# Patient Record
Sex: Female | Born: 1975 | Race: Black or African American | Hispanic: No | Marital: Single | Smoking: Former smoker
Health system: Southern US, Community
[De-identification: ages and names within clinical notes are randomized; demographics above are authoritative.]

## PROBLEM LIST (undated history)

## (undated) DIAGNOSIS — M1990 Unspecified osteoarthritis, unspecified site: Secondary | ICD-10-CM

## (undated) DIAGNOSIS — I10 Essential (primary) hypertension: Secondary | ICD-10-CM

## (undated) DIAGNOSIS — E119 Type 2 diabetes mellitus without complications: Secondary | ICD-10-CM

## (undated) DIAGNOSIS — N939 Abnormal uterine and vaginal bleeding, unspecified: Secondary | ICD-10-CM

## (undated) DIAGNOSIS — D509 Iron deficiency anemia, unspecified: Secondary | ICD-10-CM

## (undated) DIAGNOSIS — Z8669 Personal history of other diseases of the nervous system and sense organs: Secondary | ICD-10-CM

## (undated) DIAGNOSIS — R7303 Prediabetes: Secondary | ICD-10-CM

## (undated) DIAGNOSIS — N921 Excessive and frequent menstruation with irregular cycle: Secondary | ICD-10-CM

## (undated) HISTORY — DX: Essential (primary) hypertension: I10

## (undated) HISTORY — DX: Iron deficiency anemia, unspecified: D50.9

## (undated) HISTORY — DX: Excessive and frequent menstruation with irregular cycle: N92.1

---

## 1999-08-13 ENCOUNTER — Emergency Department (HOSPITAL_COMMUNITY): Admission: EM | Admit: 1999-08-13 | Discharge: 1999-08-13 | Payer: Self-pay | Admitting: Emergency Medicine

## 1999-11-27 ENCOUNTER — Other Ambulatory Visit: Admission: RE | Admit: 1999-11-27 | Discharge: 1999-11-27 | Payer: Self-pay | Admitting: Internal Medicine

## 2000-08-18 ENCOUNTER — Emergency Department (HOSPITAL_COMMUNITY): Admission: EM | Admit: 2000-08-18 | Discharge: 2000-08-18 | Payer: Self-pay | Admitting: Emergency Medicine

## 2000-09-15 ENCOUNTER — Encounter: Admission: RE | Admit: 2000-09-15 | Discharge: 2000-09-15 | Payer: Self-pay | Admitting: Internal Medicine

## 2000-09-15 ENCOUNTER — Encounter: Payer: Self-pay | Admitting: Internal Medicine

## 2000-09-24 ENCOUNTER — Encounter: Payer: Self-pay | Admitting: Emergency Medicine

## 2000-09-24 ENCOUNTER — Emergency Department (HOSPITAL_COMMUNITY): Admission: EM | Admit: 2000-09-24 | Discharge: 2000-09-24 | Payer: Self-pay | Admitting: Emergency Medicine

## 2000-10-05 ENCOUNTER — Other Ambulatory Visit: Admission: RE | Admit: 2000-10-05 | Discharge: 2000-10-05 | Payer: Self-pay | Admitting: *Deleted

## 2001-03-03 ENCOUNTER — Encounter: Payer: Self-pay | Admitting: Internal Medicine

## 2001-03-03 ENCOUNTER — Encounter: Admission: RE | Admit: 2001-03-03 | Discharge: 2001-03-03 | Payer: Self-pay | Admitting: Internal Medicine

## 2001-03-03 ENCOUNTER — Ambulatory Visit (HOSPITAL_COMMUNITY): Admission: RE | Admit: 2001-03-03 | Discharge: 2001-03-03 | Payer: Self-pay | Admitting: Internal Medicine

## 2001-12-12 ENCOUNTER — Emergency Department (HOSPITAL_COMMUNITY): Admission: EM | Admit: 2001-12-12 | Discharge: 2001-12-12 | Payer: Self-pay | Admitting: Emergency Medicine

## 2001-12-18 ENCOUNTER — Inpatient Hospital Stay (HOSPITAL_COMMUNITY): Admission: AD | Admit: 2001-12-18 | Discharge: 2001-12-18 | Payer: Self-pay | Admitting: Obstetrics

## 2002-12-30 ENCOUNTER — Inpatient Hospital Stay (HOSPITAL_COMMUNITY): Admission: AD | Admit: 2002-12-30 | Discharge: 2002-12-30 | Payer: Self-pay | Admitting: Obstetrics and Gynecology

## 2003-05-23 ENCOUNTER — Emergency Department (HOSPITAL_COMMUNITY): Admission: AD | Admit: 2003-05-23 | Discharge: 2003-05-23 | Payer: Self-pay | Admitting: Family Medicine

## 2005-03-05 ENCOUNTER — Ambulatory Visit: Payer: Self-pay | Admitting: Internal Medicine

## 2005-03-12 ENCOUNTER — Ambulatory Visit: Payer: Self-pay | Admitting: Internal Medicine

## 2005-03-18 ENCOUNTER — Ambulatory Visit: Payer: Self-pay | Admitting: Internal Medicine

## 2005-03-18 ENCOUNTER — Encounter (INDEPENDENT_AMBULATORY_CARE_PROVIDER_SITE_OTHER): Payer: Self-pay | Admitting: Internal Medicine

## 2005-03-30 ENCOUNTER — Ambulatory Visit (HOSPITAL_COMMUNITY): Admission: RE | Admit: 2005-03-30 | Discharge: 2005-03-30 | Payer: Self-pay | Admitting: Internal Medicine

## 2005-05-26 ENCOUNTER — Ambulatory Visit: Payer: Self-pay | Admitting: Internal Medicine

## 2005-10-08 ENCOUNTER — Ambulatory Visit: Payer: Self-pay | Admitting: Internal Medicine

## 2005-10-19 ENCOUNTER — Ambulatory Visit: Payer: Self-pay | Admitting: Internal Medicine

## 2006-03-01 DIAGNOSIS — I10 Essential (primary) hypertension: Secondary | ICD-10-CM | POA: Insufficient documentation

## 2006-03-01 DIAGNOSIS — R609 Edema, unspecified: Secondary | ICD-10-CM

## 2006-06-27 ENCOUNTER — Emergency Department (HOSPITAL_COMMUNITY): Admission: EM | Admit: 2006-06-27 | Discharge: 2006-06-27 | Payer: Self-pay | Admitting: Family Medicine

## 2006-09-22 ENCOUNTER — Inpatient Hospital Stay (HOSPITAL_COMMUNITY): Admission: AD | Admit: 2006-09-22 | Discharge: 2006-09-22 | Payer: Self-pay | Admitting: Obstetrics & Gynecology

## 2008-07-14 ENCOUNTER — Inpatient Hospital Stay (HOSPITAL_COMMUNITY): Admission: AD | Admit: 2008-07-14 | Discharge: 2008-07-14 | Payer: Self-pay | Admitting: Obstetrics and Gynecology

## 2008-07-14 ENCOUNTER — Ambulatory Visit: Payer: Self-pay | Admitting: Obstetrics and Gynecology

## 2008-07-25 ENCOUNTER — Emergency Department (HOSPITAL_COMMUNITY): Admission: EM | Admit: 2008-07-25 | Discharge: 2008-07-25 | Payer: Self-pay | Admitting: Family Medicine

## 2009-04-27 HISTORY — PX: TUBAL LIGATION: SHX77

## 2009-08-22 ENCOUNTER — Ambulatory Visit: Payer: Self-pay | Admitting: Obstetrics & Gynecology

## 2009-08-22 ENCOUNTER — Encounter: Payer: Self-pay | Admitting: Obstetrics & Gynecology

## 2009-08-29 ENCOUNTER — Ambulatory Visit (HOSPITAL_COMMUNITY): Admission: RE | Admit: 2009-08-29 | Discharge: 2009-08-29 | Payer: Self-pay | Admitting: Family Medicine

## 2009-08-29 ENCOUNTER — Encounter: Payer: Self-pay | Admitting: Family Medicine

## 2009-09-12 ENCOUNTER — Ambulatory Visit (HOSPITAL_COMMUNITY): Admission: RE | Admit: 2009-09-12 | Discharge: 2009-09-12 | Payer: Self-pay | Admitting: Family Medicine

## 2009-09-12 ENCOUNTER — Ambulatory Visit: Payer: Self-pay | Admitting: Obstetrics & Gynecology

## 2009-09-26 ENCOUNTER — Ambulatory Visit (HOSPITAL_COMMUNITY): Admission: RE | Admit: 2009-09-26 | Discharge: 2009-09-26 | Payer: Self-pay | Admitting: Family Medicine

## 2009-09-26 ENCOUNTER — Encounter: Payer: Self-pay | Admitting: Family Medicine

## 2009-10-03 ENCOUNTER — Ambulatory Visit: Payer: Self-pay | Admitting: Obstetrics & Gynecology

## 2009-10-17 ENCOUNTER — Ambulatory Visit: Payer: Self-pay | Admitting: Obstetrics & Gynecology

## 2009-10-17 LAB — CONVERTED CEMR LAB
Trich, Wet Prep: NONE SEEN
Yeast Wet Prep HPF POC: NONE SEEN

## 2009-10-31 ENCOUNTER — Ambulatory Visit: Payer: Self-pay | Admitting: Obstetrics and Gynecology

## 2009-11-18 ENCOUNTER — Ambulatory Visit: Payer: Self-pay | Admitting: Obstetrics & Gynecology

## 2009-11-21 ENCOUNTER — Encounter: Payer: Self-pay | Admitting: Family Medicine

## 2009-11-21 ENCOUNTER — Ambulatory Visit (HOSPITAL_COMMUNITY): Admission: RE | Admit: 2009-11-21 | Discharge: 2009-11-21 | Payer: Self-pay | Admitting: Family Medicine

## 2009-12-05 ENCOUNTER — Ambulatory Visit: Payer: Self-pay | Admitting: Obstetrics & Gynecology

## 2009-12-05 LAB — CONVERTED CEMR LAB
MCHC: 32.6 g/dL (ref 30.0–36.0)
RBC: 4.07 M/uL (ref 3.87–5.11)
WBC: 7.8 10*3/uL (ref 4.0–10.5)

## 2009-12-13 ENCOUNTER — Ambulatory Visit: Payer: Self-pay | Admitting: Obstetrics & Gynecology

## 2009-12-13 ENCOUNTER — Encounter (INDEPENDENT_AMBULATORY_CARE_PROVIDER_SITE_OTHER): Payer: Self-pay | Admitting: *Deleted

## 2009-12-19 ENCOUNTER — Ambulatory Visit: Payer: Self-pay | Admitting: Family Medicine

## 2010-01-02 ENCOUNTER — Other Ambulatory Visit: Payer: Self-pay | Admitting: Obstetrics & Gynecology

## 2010-01-02 ENCOUNTER — Ambulatory Visit: Payer: Self-pay | Admitting: Obstetrics & Gynecology

## 2010-01-16 ENCOUNTER — Encounter: Payer: Self-pay | Admitting: Family Medicine

## 2010-01-16 ENCOUNTER — Ambulatory Visit: Payer: Self-pay | Admitting: Obstetrics & Gynecology

## 2010-01-16 ENCOUNTER — Ambulatory Visit (HOSPITAL_COMMUNITY): Admission: RE | Admit: 2010-01-16 | Discharge: 2010-01-16 | Payer: Self-pay | Admitting: Family Medicine

## 2010-01-20 ENCOUNTER — Ambulatory Visit: Payer: Self-pay | Admitting: Obstetrics & Gynecology

## 2010-01-23 ENCOUNTER — Ambulatory Visit: Payer: Self-pay | Admitting: Obstetrics & Gynecology

## 2010-01-27 ENCOUNTER — Ambulatory Visit: Payer: Self-pay | Admitting: Obstetrics and Gynecology

## 2010-01-30 ENCOUNTER — Encounter: Payer: Self-pay | Admitting: Physician Assistant

## 2010-01-30 ENCOUNTER — Ambulatory Visit: Payer: Self-pay | Admitting: Obstetrics & Gynecology

## 2010-01-30 LAB — CONVERTED CEMR LAB
ALT: 11 units/L (ref 0–35)
BUN: 7 mg/dL (ref 6–23)
Calcium: 9 mg/dL (ref 8.4–10.5)
Creatinine, Ser: 0.59 mg/dL (ref 0.40–1.20)
Creatinine, Urine: 249.5 mg/dL
Glucose, Bld: 105 mg/dL — ABNORMAL HIGH (ref 70–99)
Hemoglobin: 11.1 g/dL — ABNORMAL LOW (ref 12.0–15.0)
MCHC: 32.2 g/dL (ref 30.0–36.0)
MCV: 80.8 fL (ref 78.0–100.0)
Platelets: 238 10*3/uL (ref 150–400)
Potassium: 3.9 meq/L (ref 3.5–5.3)
RDW: 15.3 % (ref 11.5–15.5)
Total Bilirubin: 0.4 mg/dL (ref 0.3–1.2)
WBC: 6.3 10*3/uL (ref 4.0–10.5)

## 2010-02-03 ENCOUNTER — Ambulatory Visit: Payer: Self-pay | Admitting: Family Medicine

## 2010-02-06 ENCOUNTER — Ambulatory Visit: Payer: Self-pay | Admitting: Family Medicine

## 2010-02-06 LAB — CONVERTED CEMR LAB
Albumin: 3.7 g/dL (ref 3.5–5.2)
Alkaline Phosphatase: 121 units/L — ABNORMAL HIGH (ref 39–117)
CO2: 25 meq/L (ref 19–32)
Chlamydia, DNA Probe: NEGATIVE
Chloride: 103 meq/L (ref 96–112)
GC Probe Amp, Genital: NEGATIVE
Glucose, Bld: 106 mg/dL — ABNORMAL HIGH (ref 70–99)
MCHC: 31.9 g/dL (ref 30.0–36.0)
Platelets: 237 10*3/uL (ref 150–400)
Total Bilirubin: 0.5 mg/dL (ref 0.3–1.2)
Total Protein: 6.8 g/dL (ref 6.0–8.3)
WBC: 6.6 10*3/uL (ref 4.0–10.5)

## 2010-02-07 ENCOUNTER — Encounter: Payer: Self-pay | Admitting: Family Medicine

## 2010-02-07 ENCOUNTER — Encounter: Payer: Self-pay | Admitting: Obstetrics & Gynecology

## 2010-02-07 LAB — CONVERTED CEMR LAB: Creatinine 24 HR UR: 889 mg/24hr (ref 700–1800)

## 2010-02-10 ENCOUNTER — Ambulatory Visit: Payer: Self-pay | Admitting: Obstetrics & Gynecology

## 2010-02-13 ENCOUNTER — Ambulatory Visit: Payer: Self-pay | Admitting: Obstetrics & Gynecology

## 2010-02-17 ENCOUNTER — Ambulatory Visit: Payer: Self-pay | Admitting: Obstetrics & Gynecology

## 2010-02-20 ENCOUNTER — Ambulatory Visit: Payer: Self-pay | Admitting: Obstetrics & Gynecology

## 2010-02-21 ENCOUNTER — Ambulatory Visit: Payer: Self-pay | Admitting: Obstetrics and Gynecology

## 2010-02-21 ENCOUNTER — Encounter: Payer: Self-pay | Admitting: Obstetrics & Gynecology

## 2010-02-21 ENCOUNTER — Inpatient Hospital Stay (HOSPITAL_COMMUNITY)
Admission: RE | Admit: 2010-02-21 | Discharge: 2010-02-24 | Payer: Self-pay | Source: Home / Self Care | Admitting: Obstetrics and Gynecology

## 2010-03-19 ENCOUNTER — Ambulatory Visit: Payer: Self-pay | Admitting: Obstetrics & Gynecology

## 2010-05-01 ENCOUNTER — Ambulatory Visit
Admission: RE | Admit: 2010-05-01 | Discharge: 2010-05-01 | Payer: Self-pay | Source: Home / Self Care | Attending: Obstetrics and Gynecology | Admitting: Obstetrics and Gynecology

## 2010-07-09 LAB — POCT URINALYSIS DIPSTICK
Bilirubin Urine: NEGATIVE
Glucose, UA: NEGATIVE mg/dL
Hgb urine dipstick: NEGATIVE
Ketones, ur: NEGATIVE mg/dL
Nitrite: NEGATIVE
Nitrite: NEGATIVE
Protein, ur: 100 mg/dL — AB
Specific Gravity, Urine: 1.025 (ref 1.005–1.030)
Specific Gravity, Urine: >=1.03 (ref 1.005–1.030)
Urobilinogen, UA: 0.2 mg/dL (ref 0.0–1.0)
Urobilinogen, UA: 0.2 mg/dL (ref 0.0–1.0)
pH: 6 (ref 5.0–8.0)
pH: 6.5 (ref 5.0–8.0)

## 2010-07-09 LAB — COMPREHENSIVE METABOLIC PANEL
ALT: 23 U/L (ref 0–35)
AST: 31 U/L (ref 0–37)
Alkaline Phosphatase: 135 U/L — ABNORMAL HIGH (ref 39–117)
Calcium: 8.7 mg/dL (ref 8.4–10.5)
Chloride: 102 mEq/L (ref 96–112)
GFR calc Af Amer: >60 mL/min (ref >60)
GFR calc non Af Amer: >60 mL/min (ref >60)
Total Bilirubin: 0.8 mg/dL (ref 0.3–1.2)
Total Protein: 7.2 g/dL (ref 6.0–8.3)

## 2010-07-09 LAB — CBC
HCT: 41.2 % (ref 36.0–46.0)
MCH: 26.9 pg (ref 26.0–34.0)
MCH: 27 pg (ref 26.0–34.0)
MCHC: 32.1 g/dL (ref 30.0–36.0)
MCV: 83.6 fL (ref 78.0–100.0)
RBC: 3.42 MIL/uL — ABNORMAL LOW (ref 3.87–5.11)
WBC: 22.8 10*3/uL — ABNORMAL HIGH (ref 4.0–10.5)
WBC: 8.2 10*3/uL (ref 4.0–10.5)

## 2010-07-09 LAB — PROTEIN / CREATININE RATIO, URINE
Protein Creatinine Ratio: 0.68 — ABNORMAL HIGH (ref 0.00–0.15)
Total Protein, Urine: 171 mg/dL

## 2010-07-09 LAB — URIC ACID: Uric Acid, Serum: 5.8 mg/dL (ref 2.4–7.0)

## 2010-07-10 LAB — POCT URINALYSIS DIPSTICK
Bilirubin Urine: NEGATIVE
Bilirubin Urine: NEGATIVE
Glucose, UA: >=1000 mg/dL — AB
Glucose, UA: NEGATIVE mg/dL
Glucose, UA: NEGATIVE mg/dL
Ketones, ur: NEGATIVE mg/dL
Ketones, ur: 15 mg/dL — AB
Ketones, ur: NEGATIVE mg/dL
Nitrite: NEGATIVE
Nitrite: NEGATIVE
Specific Gravity, Urine: >=1.03 (ref 1.005–1.030)
Urobilinogen, UA: 0.2 mg/dL (ref 0.0–1.0)
Urobilinogen, UA: 1 mg/dL (ref 0.0–1.0)
pH: 6 (ref 5.0–8.0)

## 2010-07-11 LAB — POCT URINALYSIS DIPSTICK
Bilirubin Urine: NEGATIVE
Glucose, UA: NEGATIVE mg/dL
Ketones, ur: NEGATIVE mg/dL
Specific Gravity, Urine: 1.02 (ref 1.005–1.030)
Urobilinogen, UA: 0.2 mg/dL (ref 0.0–1.0)

## 2010-07-12 LAB — POCT URINALYSIS DIP (DEVICE)
Bilirubin Urine: NEGATIVE
Glucose, UA: 250 mg/dL — AB
Hgb urine dipstick: NEGATIVE
Ketones, ur: NEGATIVE mg/dL
Nitrite: NEGATIVE
Specific Gravity, Urine: 1.02 (ref 1.005–1.030)

## 2010-07-13 LAB — POCT URINALYSIS DIP (DEVICE)
Bilirubin Urine: NEGATIVE
Ketones, ur: NEGATIVE mg/dL
Ketones, ur: NEGATIVE mg/dL
Protein, ur: 100 mg/dL — AB
Specific Gravity, Urine: 1.02 (ref 1.005–1.030)
pH: 6 (ref 5.0–8.0)

## 2010-07-14 LAB — POCT URINALYSIS DIP (DEVICE)
Bilirubin Urine: NEGATIVE
Bilirubin Urine: NEGATIVE
Glucose, UA: 500 mg/dL — AB
Glucose, UA: NEGATIVE mg/dL
Specific Gravity, Urine: >=1.03 (ref 1.005–1.030)
Specific Gravity, Urine: 1.025 (ref 1.005–1.030)
Urobilinogen, UA: 0.2 mg/dL (ref 0.0–1.0)

## 2010-07-15 LAB — POCT URINALYSIS DIP (DEVICE)
Bilirubin Urine: NEGATIVE
Hgb urine dipstick: NEGATIVE
Ketones, ur: NEGATIVE mg/dL
Specific Gravity, Urine: <=1.005 (ref 1.005–1.030)
pH: 6 (ref 5.0–8.0)

## 2010-08-07 LAB — GC/CHLAMYDIA PROBE AMP, GENITAL
Chlamydia, DNA Probe: NEGATIVE
GC Probe Amp, Genital: NEGATIVE

## 2010-10-23 ENCOUNTER — Emergency Department (HOSPITAL_COMMUNITY)
Admission: EM | Admit: 2010-10-23 | Discharge: 2010-10-23 | Disposition: A | Discharge status: Home / Self Care | Payer: Medicaid Other | Attending: Emergency Medicine | Admitting: Emergency Medicine

## 2010-10-23 DIAGNOSIS — R209 Unspecified disturbances of skin sensation: Secondary | ICD-10-CM | POA: Insufficient documentation

## 2010-10-23 DIAGNOSIS — R51 Headache: Secondary | ICD-10-CM | POA: Insufficient documentation

## 2010-10-23 DIAGNOSIS — I10 Essential (primary) hypertension: Secondary | ICD-10-CM | POA: Insufficient documentation

## 2010-10-23 LAB — POCT I-STAT, CHEM 8
Creatinine, Ser: 0.8 mg/dL (ref 0.50–1.10)
HCT: 37 % (ref 36.0–46.0)
Hemoglobin: 12.6 g/dL (ref 12.0–15.0)
Potassium: 3.8 mEq/L (ref 3.5–5.1)
Sodium: 138 mEq/L (ref 135–145)
TCO2: 26 mmol/L (ref 0–100)

## 2010-11-06 ENCOUNTER — Encounter: Payer: Self-pay | Admitting: Internal Medicine

## 2010-11-10 ENCOUNTER — Encounter: Payer: Self-pay | Admitting: Internal Medicine

## 2010-11-10 ENCOUNTER — Ambulatory Visit (INDEPENDENT_AMBULATORY_CARE_PROVIDER_SITE_OTHER): Payer: Self-pay | Admitting: Internal Medicine

## 2010-11-10 DIAGNOSIS — R209 Unspecified disturbances of skin sensation: Secondary | ICD-10-CM

## 2010-11-10 DIAGNOSIS — R51 Headache: Secondary | ICD-10-CM

## 2010-11-10 DIAGNOSIS — Z0000 Encounter for general adult medical examination without abnormal findings: Secondary | ICD-10-CM | POA: Insufficient documentation

## 2010-11-10 DIAGNOSIS — R200 Anesthesia of skin: Secondary | ICD-10-CM | POA: Insufficient documentation

## 2010-11-10 DIAGNOSIS — R519 Headache, unspecified: Secondary | ICD-10-CM | POA: Insufficient documentation

## 2010-11-10 DIAGNOSIS — Z1239 Encounter for other screening for malignant neoplasm of breast: Secondary | ICD-10-CM | POA: Insufficient documentation

## 2010-11-10 DIAGNOSIS — I10 Essential (primary) hypertension: Secondary | ICD-10-CM

## 2010-11-10 LAB — CBC
HCT: 35 % — ABNORMAL LOW (ref 36.0–46.0)
Hemoglobin: 11.2 g/dL — ABNORMAL LOW (ref 12.0–15.0)
MCHC: 32 g/dL (ref 30.0–36.0)
RBC: 4.6 MIL/uL (ref 3.87–5.11)
WBC: 6.4 10*3/uL (ref 4.0–10.5)

## 2010-11-10 LAB — LIPID PANEL
Cholesterol: 149 mg/dL (ref 0–200)
HDL: 56 mg/dL (ref >39)
LDL Cholesterol: 78 mg/dL (ref 0–99)
Triglycerides: 75 mg/dL (ref <150)
VLDL: 15 mg/dL (ref 0–40)

## 2010-11-10 MED ORDER — FLUTICASONE PROPIONATE 50 MCG/ACT NA SUSP: 2.0000 | Freq: Every day | NASAL | Status: DC

## 2010-11-10 MED ORDER — HYDROCHLOROTHIAZIDE 25 MG PO TABS: 25.0000 mg | ORAL_TABLET | Freq: Every day | ORAL | Status: DC

## 2010-11-10 MED ORDER — AMLODIPINE BESYLATE 5 MG PO TABS: 5.0000 mg | ORAL_TABLET | Freq: Every day | ORAL | Status: DC

## 2010-11-10 NOTE — Assessment & Plan Note (Addendum)
Poor control.  BP high today and typical of her home pressures while taking HCTZ.    Plan: Continue HCTZ 25 mg daily Start Amlodipine 5mg  daily BMET today

## 2010-11-10 NOTE — Progress Notes (Deleted)
  Subjective:    Patient ID: Nancy Silva, female    DOB: 1975-05-23, 34 y.o.   MRN: [id number]  HPI    Review of Systems     Objective:   Physical Exam        Assessment & Plan:

## 2010-11-10 NOTE — Assessment & Plan Note (Addendum)
R>L hand.  Possibly correlated with headaches per history.  Based on distribution and positive Phalen's sign in setting of a lot of computer use at work, could also be carpal tunnel syndrome.  Other peripheral neuropathies would also be on the differential.  Neck exam and age make degenerative cervical disease less likely.  Consider further workup for neuropathy if brain MRI comes back negative.    Plan: Follow-up brain MRI result and re-evaluate.

## 2010-11-10 NOTE — Assessment & Plan Note (Addendum)
Will get lipid panel today as well as BMET (glucose).  Has not eaten for 5 hrs before going to the lab for these.  At 2 week follow-up she needs a tdap as well.

## 2010-11-10 NOTE — Progress Notes (Signed)
Subjective:    Patient ID: Nancy Silva, female    DOB: 10-16-1975, 35 y.o.   MRN: [id number]  HPI  [name] is a 35 y/o with a history of hypertension and preeclampsia who presents with a two month history of daily headaches.  The headache is there when she wakes up in the morning and gets worse throughout the day.  The pain is throbbing and varies in location between her forehead, temples, and behind her eyes.  Today has been one of the best days and her pain is currently 5/10 severity.  When the headaches are bad, she experiences photophobia and sometimes sees dark spots.  Her vision gets a little blurry when she has a bad headache, but no double vision.  No flashing lights or blind spots.  She does have mild nausea at times but no vomiting.  During the worst headaches she sometimes experiences chest tightness that quickly resolves.  No SOB or dyspnea on exertion.  No fever or chills.  She presented to the ED two week ago and was given ibuprofen 800mg , which helps some.  Tylenol 1000mg  also helps somewhat.  She experiences numbness and tingling in her hands, R>L.  These are worse when her headache is worse.  She also experiences a sharp pain in her right thigh and right ankle when her headaches are at the worst.    She does have a history of chronic nasal/sinus problems, which are currently at her baseline of "always there".    She has a sister with multiple sclerosis.  Her mother died of unknown cancer at age 1.    She has a history of hypertension with pressures at home in 140s/90s usually, with the highest being 170/98.  She was previously on HCTZ before running out of pills a several months ago, and she was restarted on HCTZ two weeks ago in the ED.  (140s/90s is while taking HCTZ)     Review of Systems Review of Systems - History obtained from the patient General ROS: negative for - chills, fatigue, fever or weight loss Respiratory ROS: no cough, shortness of breath, or  wheezing Cardiovascular ROS: positive for - chest tightness as per HPI negative for - dyspnea on exertion, irregular heartbeat, palpitations or shortness of breath Gastrointestinal ROS: no abdominal pain, change in bowel habits, or black or bloody stools Genito-Urinary ROS: no dysuria, trouble voiding, or hematuria Neurological ROS: See HPI      Objective:   Physical Exam  Filed Vitals:   11/10/10 1511  BP: 146/94  Pulse: 75  Temp: 97.1 F (36.2 C)    General: alert, well-developed, and cooperative to examination.  Head: normocephalic and atraumatic.  Eyes: vision grossly intact, pupils equal, pupils round, pupils reactive to light, no injection and anicteric.  Mouth: pharynx pink and moist, no erythema, and no exudates.  Neck: supple, full ROM, no thyromegaly, no JVD.  No tenderness to palpation. Lungs: normal respiratory effort, no accessory muscle use, normal breath sounds, no crackles, and no wheezes. Heart: normal rate, regular rhythm, no murmur, no gallop, and no rub.  Pulses: 2+ DP/PT pulses bilaterally  Extremities: No cyanosis, clubbing, edema.  No tenderness to palpation in hands, thighs, or ankles.  No erythema in hands.  Neurologic: alert & oriented X3, cranial nerves II-XII intact, strength normal in all extremities distally and proximally. Good strength in all fingers.  Sensation intact to light touch intact and symmetric in both hands on volar and dorsal surfaces.   Negative  Tinel's sign bilaterally.  Positive Phalen's sign.  Skin: turgor normal and no rashes.  Psych: Oriented X3, memory intact for recent and remote, normally interactive, good eye contact, not anxious appearing, and not depressed appearing.        Assessment & Plan:

## 2010-11-10 NOTE — Assessment & Plan Note (Addendum)
Continuous nature with possibly correlated neurological symptoms in her hands and right leg.  May be related to her chronic sinusitis, but CNS etiology, including multiple sclerosis, must also be ruled out.   Could also be a result of her HTN, but her pressures do not seem to be extreme enough to cause such headaches.  Continuous nature is less typical of migraine.  Tension headaches also on the differential.  Plan: Start Flonase daily Brain MRI with contrast Continue ibuprofen and tylenol PRN CBC today Follow-up in two weeks

## 2010-11-10 NOTE — Patient Instructions (Signed)
Please follow-up in 2 weeks.  We will order an MRI for you once you have met with Rudell Cobb.

## 2010-11-11 ENCOUNTER — Other Ambulatory Visit: Payer: Self-pay | Admitting: Internal Medicine

## 2010-11-11 DIAGNOSIS — I10 Essential (primary) hypertension: Secondary | ICD-10-CM

## 2010-11-11 LAB — BASIC METABOLIC PANEL WITH GFR
BUN: 10 mg/dL (ref 6–23)
Chloride: 99 mEq/L (ref 96–112)
GFR, Est African American: >60 mL/min (ref >60)
GFR, Est Non African American: >60 mL/min (ref >60)
Potassium: 3.1 mEq/L — ABNORMAL LOW (ref 3.5–5.3)
Sodium: 138 mEq/L (ref 135–145)

## 2010-11-11 MED ORDER — TRIAMTERENE-HCTZ 37.5-25 MG PO CAPS: 1.0000 | ORAL_CAPSULE | ORAL | Status: DC

## 2010-11-11 NOTE — Progress Notes (Signed)
In response to K 3.1 yesterday, medication change.  Discontinued HCTZ 25mg  daily and started Triamterene-HCTZ 37.5-25mg  daily.  I called the patient about the lab result and medication change, and I called in the prescription to [facility] at [facility] at [address] [phone].

## 2010-11-21 NOTE — Progress Notes (Signed)
I agree with Dr. Thane Edu assessment and plan.

## 2010-11-24 ENCOUNTER — Ambulatory Visit: Payer: Self-pay | Admitting: Internal Medicine

## 2010-11-28 ENCOUNTER — Ambulatory Visit: Payer: Self-pay | Admitting: Physician Assistant

## 2010-12-15 ENCOUNTER — Ambulatory Visit (INDEPENDENT_AMBULATORY_CARE_PROVIDER_SITE_OTHER): Payer: Self-pay | Admitting: Internal Medicine

## 2010-12-15 ENCOUNTER — Encounter: Payer: Self-pay | Admitting: Internal Medicine

## 2010-12-15 VITALS — BP 152/84 | HR 110 | Temp 97.8°F | Ht 67.0 in | Wt 227.6 lb

## 2010-12-15 DIAGNOSIS — Z23 Encounter for immunization: Secondary | ICD-10-CM

## 2010-12-15 DIAGNOSIS — Z0000 Encounter for general adult medical examination without abnormal findings: Secondary | ICD-10-CM

## 2010-12-15 DIAGNOSIS — R51 Headache: Secondary | ICD-10-CM

## 2010-12-15 DIAGNOSIS — I10 Essential (primary) hypertension: Secondary | ICD-10-CM

## 2010-12-15 DIAGNOSIS — R200 Anesthesia of skin: Secondary | ICD-10-CM

## 2010-12-15 DIAGNOSIS — J302 Other seasonal allergic rhinitis: Secondary | ICD-10-CM

## 2010-12-15 DIAGNOSIS — J309 Allergic rhinitis, unspecified: Secondary | ICD-10-CM

## 2010-12-15 DIAGNOSIS — R209 Unspecified disturbances of skin sensation: Secondary | ICD-10-CM

## 2010-12-15 MED ORDER — FLUTICASONE PROPIONATE 50 MCG/ACT NA SUSP: 2.0000 | Freq: Every day | NASAL | Status: DC

## 2010-12-15 NOTE — Assessment & Plan Note (Addendum)
Much improved.  Has only had two headaches since her daily headaches resolved on day #3 of taking flonase.  With this resolution, her headaches are likely sinus headaches/due to chronic sinusitis.  In retrospect, her two months of chronic headaches would have started in April or May, which fits seasonal allergies as a cause of sinus headaches.  Will hold off on MRI at this time, which she had not yet had done, since she has a very likely diagnosis due to resolution with treatment.    Will continue Flonase two sprays (one each nostril) daily until follow-up in three months for maintenance treatment of her sinus allergies.

## 2010-12-15 NOTE — Assessment & Plan Note (Signed)
Improved.  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   Recommended that she try night time splinting while sleeping if she continues to have tingling.  Explained that she can get a splint to keep her hand in about 30-45 deg of flexion at a pharmacy otc.

## 2010-12-15 NOTE — Progress Notes (Signed)
Subjective:    Patient ID: Nancy Silva, female    DOB: [birth year], 35 y.o.   MRN: [id number]  HPI [name] is a 35 y/o woman with a history of HTN and allergies who presented last visit with a two month history of continuous headaches every day.  She was prescribed a two week course of flonase, and an MRI was to be scheduled.  Her headaches resolved after the first three days of flonase, which she continued for the whole 14 day course.  She has had two headaches since then in the subsequent month, both times when her seasonal allergies were aggravated.   She also had tingling in her hands associated with the headache, R>L hand.  She reports that the left hand tingling has resolved and she has had the R hand tingling much less often.  This happens mostly after work typing all day or when she wakes up in the morning.  The stabbing pain she was having in her thigh has not occurred since last visit either.     Review of Systems ROS: General: no fevers, chills, changes in weight, changes in appetite Skin: no rash HEENT: no blurry vision, hearing changes, sore throat Pulm: no dyspnea, coughing, wheezing CV: no chest pain, palpitations, shortness of breath Abd: no abdominal pain, nausea/vomiting, diarrhea/constipation GU: no dysuria, hematuria, polyuria Ext: no arthralgias, myalgias Neuro: see HPI      Objective:   Physical Exam Filed Vitals:   12/15/10 1549  BP: 152/84  Pulse: 110  Temp: 97.8 F (36.6 C)   Recheck: BP 136/72  P 102  General: alert, well-developed, and cooperative to examination.  Head: normocephalic and atraumatic.  Eyes: vision grossly intact, pupils equal, pupils round, pupils reactive to light, no injection and anicteric.  Neck: supple, full ROM, no JVD. Lungs: normal respiratory effort, no accessory muscle use, normal breath sounds, no crackles, and no wheezes. Heart: normal rate, regular rhythm, no murmur, no gallop, and no rub.  Pulses: 2+ DP/PT  pulses bilaterally Extremities: No cyanosis, clubbing, edema.   Neurologic: alert & oriented X3, cranial nerves II-XII intact, strength normal in all extremities, sensation intact to light touch and pinprick in all fingers and symmetric between hands, and gait normal. Phalens and tinel's signs negative in R hand.  No thenar atrophy in R hand. Skin: turgor normal and no rashes.  Psych: Oriented X3, memory intact for recent and remote, normally interactive, good eye contact, not anxious appearing, and not depressed appearing.      Assessment & Plan:

## 2010-12-15 NOTE — Assessment & Plan Note (Signed)
Tdap given today.

## 2010-12-15 NOTE — Patient Instructions (Signed)
Please return to clinic in three months

## 2010-12-15 NOTE — Progress Notes (Deleted)
  Subjective:    Patient ID: Nancy Silva, female    DOB: 06/08/1975, 35 y.o.   MRN: [id number]  HPI    Review of Systems     Objective:   Physical Exam        Assessment & Plan:   

## 2010-12-15 NOTE — Assessment & Plan Note (Signed)
With the resolution of her headaches, her headaches are likely sinus headaches/due to chronic sinusitis, likely due to allergies.  In retrospect, her two months of chronic headaches would have started in April or May, which fits seasonal allergies as a cause of sinus headaches.   Will continue Flonase two sprays (one each nostril) daily until follow-up in three months for maintenance treatment of her sinus allergies.

## 2010-12-15 NOTE — Progress Notes (Signed)
Addended by: Emeline General on: 12/15/2010 04:41 PM   Modules accepted: Orders

## 2010-12-15 NOTE — Assessment & Plan Note (Addendum)
On recheck today blood pressure appears to be well controlled on Amlodipine and Dyazide.  She reports that BP and pulse were elevated on arrival because she was rushing to make her appointment, which fits with her time of arrival.   Will check BMET today since she was hypokalemic on HCTZ last visit.  HCTZ was subsequently switched to Dyazide.   Follow-up for recheck for her HTN in three months.

## 2010-12-16 LAB — BASIC METABOLIC PANEL WITH GFR
BUN: 12 mg/dL (ref 6–23)
Calcium: 9.4 mg/dL (ref 8.4–10.5)
Chloride: 101 mEq/L (ref 96–112)
Creat: 0.89 mg/dL (ref 0.50–1.10)
GFR, Est Non African American: >60 mL/min (ref >60)

## 2010-12-22 NOTE — Progress Notes (Signed)
I discssed Nancy Silva with Dr Yaakov Guthrie and agree with his assessment and plan as outlined here.

## 2011-04-16 ENCOUNTER — Encounter: Payer: Self-pay | Admitting: Family

## 2011-04-16 ENCOUNTER — Ambulatory Visit (INDEPENDENT_AMBULATORY_CARE_PROVIDER_SITE_OTHER): Payer: Self-pay | Admitting: Family

## 2011-04-16 VITALS — BP 145/80 | HR 77 | Temp 98.5°F | Ht 67.0 in | Wt 224.1 lb

## 2011-04-16 DIAGNOSIS — Z0000 Encounter for general adult medical examination without abnormal findings: Secondary | ICD-10-CM

## 2011-04-16 DIAGNOSIS — Z01419 Encounter for gynecological examination (general) (routine) without abnormal findings: Secondary | ICD-10-CM

## 2011-04-16 LAB — WET PREP, GENITAL: Yeast Wet Prep HPF POC: NONE SEEN

## 2011-04-16 NOTE — Progress Notes (Signed)
  Subjective:     Nancy Silva is a 35 y.o. female and is here for a comprehensive physical exam. The patient reports problems - white vaginal irritation with odor x 2 weeks.  Also reports left sided pelvic pain that started two weeks ago.  Marland Kitchen  History   Social History  . Marital Status: Single    Spouse Name: N/A    Number of Children: N/A  . Years of Education: N/A   Occupational History  . Not on file.   Social History Main Topics  . Smoking status: Former Smoker    Types: Cigarettes    Quit date: 11/10/2007  . Smokeless tobacco: Never Used  . Alcohol Use: No  . Drug Use: No  . Sexually Active: Yes    Birth Control/ Protection: None   Other Topics Concern  . Not on file   Social History Narrative  . No narrative on file   Health Maintenance  Topic Date Due  . Pap Smear  08/14/1992  . Influenza Vaccine  01/26/2011  . Tetanus/tdap  12/14/2020    The following portions of the patient's history were reviewed and updated as appropriate: allergies, current medications, past family history, past medical history, past social history, past surgical history and problem list.  Review of Systems Pertinent items are noted in HPI.   Objective:    BP 145/80  Pulse 77  Temp(Src) 98.5 F (36.9 C) (Oral)  Ht 5\' 7"  (1.702 m)  Wt 224 lb 1.6 oz (101.651 kg)  BMI 35.10 kg/m2  LMP 03/22/2011 General appearance: alert, cooperative and appears stated age Head: Normocephalic, without obvious abnormality, atraumatic Neck: no adenopathy, no carotid bruit, no JVD, supple, symmetrical, trachea midline and thyroid not enlarged, symmetric, no tenderness/mass/nodules Lungs: clear to auscultation bilaterally Breasts: normal appearance, no masses or tenderness Heart: regular rate and rhythm, S1, S2 normal, no murmur, click, rub or gallop Abdomen: soft, non-tender; bowel sounds normal; no masses,  no organomegaly Pelvic: cervix normal in appearance, external genitalia normal, no adnexal  masses or tenderness, no cervical motion tenderness, rectovaginal septum normal and uterus normal size, shape, and consistency; adnexa and uterus difficult to palpate secondary to weight.  Yellowish-green vaginal discharge, no odor. Extremities: extremities normal, atraumatic, no cyanosis or edema    Assessment:    Well Woman Exam Abnormal Vaginal Discharge Pelvic Pain   Plan:  GC/CT pending Pap smear to lab Wet prep - lab Pelvic US schedule    F/u in 2 weeks [facility]

## 2011-04-16 NOTE — Patient Instructions (Signed)
Vaginitis Vaginitis is an infection. It causes soreness, swelling, and redness (inflammation) of the vagina. Many of these infections are sexually transmitted diseases. Having unprotected sex can cause further problems and complications such as:  Chronic pelvic pain.     Infertility.    Unwanted pregnancy.     Abortions.    Tubal pregnancy.     Infection passed on to the newborn baby.     Cancer.  CAUSES   Monilia. A yeast/fungus infection, not a sexually transmitted disease.     Bacterial vaginosis. The normal balance of bacteria in the vagina is disrupted and is replaced by an overgrowth of certain bacteria.     Gonorrhea, chlamydia. These are bacterial infections that are sexually transmitted diseases (STD).     Vaginal sponges, diaphragms, and intrauterine devices.     Trichomoniasis. This is a protozoa parasite that is a STD.     Viruses like herpes and human papilloma virus. Both are STD's.     Pregnancy.    Immunosupression (HIV).     Using bubble bath.     Taking certain medications that kill germs (antibiotics).     Sporadic recurrence can occur if you become ill.     Diabetes.    Steriods.    Allergic reaction. If you have an allergy to:     Douches.    Soaps.    Spermicides.    Condoms.    Scented tampons and vaginal sprays.  SYMPTOMS    Abnormal vaginal discharge.     Itching of the vagina.     Pain in the vagina.     Swelling of the vagina.     In some cases, there are no symptoms.  TREATMENT   Treatment will vary depending on the type of infection.  Bacteria and trichomonas - usually oral antibiotics and sometimes vaginal cream or suppositories.     Monilia vaginitis - vaginal creams, suppositories, or oral antifungal.     Viral vaginitis has no cure. However, the symptoms of herpes (a viral vaginitis) can be treated to relieve the discomfort. Human papilloma virus has no symptoms . However, there are treatments for the diseases  caused by human papilloma virus.     Allergic vaginitis. Stop using the product that is causing the problem. Vaginal creams can be used to treat the symptoms.     When treating an STD, the sex partner should also be treated.  HOME CARE INSTRUCTIONS    Take all the medications as prescribed by your caregiver.     Do not use scented tampons, soaps, or vaginal sprays.     Do not douche.     Tell your sex partner if you have a vaginal infection or a STD.     Do not have sexual intercourse until you have cured the vaginitis.     Practice safe sex by using condoms .  SEEK MEDICAL CARE IF:    You have an oral temperature above 102 F (38.9 C).     You develop abdominal pain.     Your symptoms get worse during treatment.  Document Released: 02/08/2007 Document Revised: 10/27/2010 Document Reviewed: 10/04/2008 [facility] Patient Information 2012 [place], [place].

## 2011-04-27 ENCOUNTER — Ambulatory Visit (HOSPITAL_COMMUNITY)
Admission: RE | Admit: 2011-04-27 | Discharge: 2011-04-27 | Disposition: A | Discharge status: Home / Self Care | Payer: Self-pay | Source: Ambulatory Visit | Attending: Obstetrics & Gynecology | Admitting: Obstetrics & Gynecology

## 2011-04-27 DIAGNOSIS — Z8041 Family history of malignant neoplasm of ovary: Secondary | ICD-10-CM | POA: Insufficient documentation

## 2011-04-27 DIAGNOSIS — N949 Unspecified condition associated with female genital organs and menstrual cycle: Secondary | ICD-10-CM | POA: Insufficient documentation

## 2011-04-27 DIAGNOSIS — Z01419 Encounter for gynecological examination (general) (routine) without abnormal findings: Secondary | ICD-10-CM

## 2011-06-08 ENCOUNTER — Telehealth: Payer: Self-pay | Admitting: *Deleted

## 2011-06-08 NOTE — Telephone Encounter (Signed)
Pt left message requesting results from last visit- Pap and wet prep.  Do not leave a message if no answer on call back.

## 2011-06-09 NOTE — Telephone Encounter (Signed)
Returned pt call and was told by a female that answered the phone that Nancy Silva is @ work. I stated that I will call back.   *Note: pt had negative wet prep @ last visit. 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 She will need to continue to get yearly Pap tests. Her GC/Chlamydia tests were negative.

## 2011-06-10 NOTE — Telephone Encounter (Signed)
Called and spoke w/pt. I informed her of all test results. She had many questions about the +HPV on her Pap.  I answered them to her satisfaction. Pt states she is still having a vaginal d/c similar to when she was in our office. She has tried OTC meds without good results. I stated that something may have changed since her visit on 04/16/11. She may call back tomorrow to schedule office appt if desired. Pt voiced understanding.

## 2011-06-15 ENCOUNTER — Ambulatory Visit: Payer: Self-pay | Admitting: Family Medicine

## 2011-06-24 ENCOUNTER — Ambulatory Visit: Payer: Self-pay | Admitting: Obstetrics and Gynecology

## 2011-09-07 ENCOUNTER — Encounter: Payer: Self-pay | Admitting: Internal Medicine

## 2011-09-07 ENCOUNTER — Ambulatory Visit (INDEPENDENT_AMBULATORY_CARE_PROVIDER_SITE_OTHER): Payer: Self-pay | Admitting: Internal Medicine

## 2011-09-07 VITALS — BP 130/82 | HR 76 | Temp 97.3°F | Ht 67.0 in | Wt 228.4 lb

## 2011-09-07 DIAGNOSIS — J302 Other seasonal allergic rhinitis: Secondary | ICD-10-CM

## 2011-09-07 DIAGNOSIS — I10 Essential (primary) hypertension: Secondary | ICD-10-CM

## 2011-09-07 MED ORDER — AMLODIPINE BESYLATE 5 MG PO TABS: 5.0000 mg | ORAL_TABLET | Freq: Every day | ORAL | Status: DC

## 2011-09-07 MED ORDER — FLUTICASONE PROPIONATE 50 MCG/ACT NA SUSP: 2.0000 | Freq: Every day | NASAL | Status: DC

## 2011-09-07 MED ORDER — TRIAMTERENE-HCTZ 37.5-25 MG PO CAPS: 1.0000 | ORAL_CAPSULE | ORAL | Status: DC

## 2011-09-07 NOTE — Patient Instructions (Signed)
Please return to clinic in 1 year. Please take your blood pressure once per day for 2 weeks and record the result.  Please rest for at least 5 minutes before taking your blood pressure.  Call the clinic after two weeks and ask for the triage nurse.  Please give her a range of your blood pressures, the high and low for both top and bottom number.

## 2011-09-26 NOTE — Assessment & Plan Note (Addendum)
BP on my recheck 130/82. At home BPs 135-145 / 80s. Looks good on current regimen, recheck in 3 months.  Continue Dyazide combination plus amlodipine.  Asked patient to get cuff and check her blood pressure for a couple weeks at home and call clinic with the measurements.  If these look ok she can go one year until next visit

## 2011-09-26 NOTE — Progress Notes (Signed)
  Subjective:   Patient ID: Nancy Silva female   DOB: [birth year] 36 y.o.   MRN: [id number]  HPI: [name] is a 36 y.o. with HTN here for follow-up and med refills.  No complaints    Past Medical History  Diagnosis Date  . Hypertension    Current Outpatient Prescriptions  Medication Sig Dispense Refill  . amLODipine (NORVASC) 5 MG tablet Take 1 tablet (5 mg total) by mouth daily.  30 tablet  11  . fluticasone (FLONASE) 50 MCG/ACT nasal spray Place 2 sprays into the nose daily. Place 1 spray per nostril daily  16 g  11  . triamterene-hydrochlorothiazide (DYAZIDE) 37.5-25 MG per capsule Take 1 each (1 capsule total) by mouth every morning.  30 capsule  11   Family History  Problem Relation Age of Onset  . Cancer Mother     ovarian   History   Social History  . Marital Status: Single    Spouse Name: N/A    Number of Children: N/A  . Years of Education: N/A   Social History Main Topics  . Smoking status: Former Smoker    Types: Cigarettes    Quit date: 11/10/2007  . Smokeless tobacco: Never Used  . Alcohol Use: No  . Drug Use: No  . Sexually Active: Yes    Birth Control/ Protection: None   Other Topics Concern  . None   Social History Narrative  . None   Review of Systems: Constitutional: Denies fever, chills, diaphoresis, appetite change and fatigue.  Respiratory: Denies SOB, DOE, cough, chest tightness,  and wheezing.   Cardiovascular: Denies chest pain, palpitations and leg swelling.  Gastrointestinal: Denies nausea, vomiting, abdominal pain, diarrhea, constipation, blood in stool and abdominal distention.  Genitourinary: Denies dysuria, urgency, frequency, hematuria, flank pain and difficulty urinating.  Musculoskeletal: Denies myalgias, back pain, joint swelling, arthralgias and gait problem.  Skin: Denies pallor, rash and wound.  Neurological: Denies dizziness, seizures, syncope, weakness, light-headedness, numbness and headaches.     Objective:  Physical Exam: Filed Vitals:   09/07/11 1419 09/07/11 1500  BP: 151/94 130/82  Pulse: 76   Temp: 97.3 F (36.3 C)   TempSrc: Oral   Height: 5\' 7"  (1.702 m)   Weight: 228 lb 6.4 oz (103.602 kg)    Constitutional: Vital signs reviewed.  Patient is a well-developed and well-nourished woman in no acute distress and cooperative with exam. Alert and oriented x3.  Head: Normocephalic and atraumatic Mouth: no erythema or exudates, MMM Eyes: PERRL, EOMI, conjunctivae normal, No scleral icterus.  Neck: No JVD  Cardiovascular: RRR, S1 normal, S2 normal, no MRG, pulses symmetric and intact bilaterally Pulmonary/Chest: CTAB, no wheezes, rales, or rhonchi Musculoskeletal: No joint deformities, erythema, or stiffness, ROM full and no nontender Neurological: A&O x3, Strength is normal and symmetric bilaterally, cranial nerve II-XII are grossly intact, no focal motor deficit, sensory intact to light touch bilaterally.  Skin: Warm, dry and intact. No rash, cyanosis, or clubbing.    Assessment & Plan:

## 2011-11-15 ENCOUNTER — Encounter (HOSPITAL_COMMUNITY): Payer: Self-pay | Admitting: *Deleted

## 2011-11-15 ENCOUNTER — Inpatient Hospital Stay (HOSPITAL_COMMUNITY)
Admission: AD | Admit: 2011-11-15 | Discharge: 2011-11-15 | Disposition: A | Discharge status: Home / Self Care | Payer: Self-pay | Source: Ambulatory Visit | Attending: Obstetrics and Gynecology | Admitting: Obstetrics and Gynecology

## 2011-11-15 DIAGNOSIS — N949 Unspecified condition associated with female genital organs and menstrual cycle: Secondary | ICD-10-CM | POA: Insufficient documentation

## 2011-11-15 DIAGNOSIS — A5901 Trichomonal vulvovaginitis: Secondary | ICD-10-CM | POA: Insufficient documentation

## 2011-11-15 LAB — URINALYSIS, ROUTINE W REFLEX MICROSCOPIC
Glucose, UA: NEGATIVE mg/dL
Nitrite: NEGATIVE
Specific Gravity, Urine: 1.025 (ref 1.005–1.030)
pH: 6 (ref 5.0–8.0)

## 2011-11-15 LAB — WET PREP, GENITAL: Clue Cells Wet Prep HPF POC: NONE SEEN

## 2011-11-15 LAB — GLUCOSE, CAPILLARY: Glucose-Capillary: 117 mg/dL — ABNORMAL HIGH (ref 70–99)

## 2011-11-15 LAB — URINE MICROSCOPIC-ADD ON

## 2011-11-15 MED ORDER — METRONIDAZOLE 500 MG PO TABS: 500.0000 mg | ORAL_TABLET | Freq: Two times a day (BID) | ORAL | Status: AC

## 2011-11-15 MED ORDER — METRONIDAZOLE 500 MG PO TABS: 500.0000 mg | ORAL_TABLET | Freq: Two times a day (BID) | ORAL | Status: DC

## 2011-11-15 MED ORDER — FLUCONAZOLE 150 MG PO TABS
150.0000 mg | ORAL_TABLET | Freq: Once | ORAL | Status: AC
  Administered 2011-11-15: 150 mg via ORAL
  Filled 2011-11-15: qty 1

## 2011-11-15 NOTE — MAU Note (Signed)
Pt reports having a thick yellow mucusy vaginal discharge x 1 month. Has tried some over the counter yeast medication without much relief.

## 2011-11-15 NOTE — MAU Provider Note (Signed)
Attestation of Attending Supervision of Advanced Practitioner: Evaluation and management procedures were performed by the PA/NP/CNM/OB Fellow under my supervision/collaboration. Chart reviewed and agree with management and plan.  Littleton Haub V 11/15/2011 1:39 PM    

## 2011-11-15 NOTE — [facility] Provider Note (Signed)
History     CSN: [id number]  Arrival date and time: 11/15/11 1212   First Provider Initiated Contact with Patient 11/15/11 1245      Chief Complaint  Patient presents with  . Vaginal Discharge   HPI Nancy Silva is a 36 y.o. female who presents to [facility]U with vaginal discharge. She is not pregnant. The discharge started about a month ago. She describes the discharge as creamy yellow. Associated symptoms include itching and irritation with some vaginal swelling. She has used OTC medication without relief. LMP 11/02/11. No birth control, condoms sometimes. Current sex partner >2 years. Hx of Chlamydia. Last pap smear less than one year ago here at the [facility] and was abnormal. Dx with HPV. The history was provided by the patient.  OB History    Grav Para Term Preterm Abortions TAB SAB Ect Mult Living   3 2 2  0 1 0 1 0 0 2      Past Medical History  Diagnosis Date  . Hypertension     Past Surgical History  Procedure Date  . Cesarean section   . Tubal ligation 2011    Family History  Problem Relation Age of Onset  . Cancer Mother     ovarian    History  Substance Use Topics  . Smoking status: Former Smoker    Types: Cigarettes    Quit date: 11/10/2007  . Smokeless tobacco: Never Used  . Alcohol Use: No    Allergies:  Allergies  Allergen Reactions  . Peanuts (Nuts)     Throat swelling  . Shellfish Allergy     Throat Swelling    Prescriptions prior to admission  Medication Sig Dispense Refill  . amLODipine (NORVASC) 5 MG tablet Take 1 tablet (5 mg total) by mouth daily.  30 tablet  11  . fluticasone (FLONASE) 50 MCG/ACT nasal spray Place 2 sprays into the nose daily. Place 1 spray per nostril daily  16 g  11  . triamterene-hydrochlorothiazide (DYAZIDE) 37.5-25 MG per capsule Take 1 each (1 capsule total) by mouth every morning.  30 capsule  11    Review of Systems  Constitutional: Negative for fever, chills and weight loss.  HENT: Negative for ear  pain, nosebleeds, congestion, sore throat and neck pain.   Eyes: Negative for blurred vision, double vision, photophobia and pain.  Respiratory: Negative for cough, shortness of breath and wheezing.   Cardiovascular: Negative for chest pain, palpitations and leg swelling.  Gastrointestinal: Negative for heartburn, nausea, vomiting, abdominal pain, diarrhea and constipation.  Genitourinary: Negative for dysuria, urgency and frequency.       Vaginal discharge  Musculoskeletal: Negative for myalgias and back pain.  Skin: Positive for rash (eczema). Negative for itching.  Neurological: Negative for dizziness, sensory change, speech change, seizures, weakness and headaches.  Endo/Heme/Allergies: Does not bruise/bleed easily.  Psychiatric/Behavioral: Negative for depression. The patient is not nervous/anxious and does not have insomnia.    Physical Exam   Blood pressure 132/74, pulse 96, temperature 97.1 F (36.2 C), temperature source Oral, resp. rate 18, last menstrual period 11/02/2011.  Physical Exam  Nursing note and vitals reviewed. Constitutional: She is oriented to person, place, and time. She appears well-developed and well-nourished. No distress.  HENT:  Head: Normocephalic and atraumatic.  Eyes: EOM are normal.  Neck: Neck supple.  Cardiovascular: Normal rate.   Respiratory: Effort normal.  GI: Soft. There is no tenderness.  Genitourinary:       External genitalia with erythema and  edema. Thick yellow vaginal discharge. Cervix inflamed, no CMT, no adnexal tenderness. Uterus without palpable enlargement.  Musculoskeletal: Normal range of motion.  Neurological: She is alert and oriented to person, place, and time.  Skin: Skin is warm and dry.  Psychiatric: She has a normal mood and affect. Her behavior is normal. Judgment and thought content normal.   Results for orders placed during the hospital encounter of 11/15/11 (from the past 24 hour(s))  URINALYSIS, ROUTINE W REFLEX  MICROSCOPIC     Status: Abnormal   Collection Time   11/15/11 12:18 PM      Component Value Range   Color, Urine YELLOW  YELLOW   APPearance CLOUDY (*) CLEAR   Specific Gravity, Urine 1.025  1.005 - 1.030   pH 6.0  5.0 - 8.0   Glucose, UA NEGATIVE  NEGATIVE mg/dL   Hgb urine dipstick MODERATE (*) NEGATIVE   Bilirubin Urine NEGATIVE  NEGATIVE   Ketones, ur NEGATIVE  NEGATIVE mg/dL   Protein, ur 30 (*) NEGATIVE mg/dL   Urobilinogen, UA 0.2  0.0 - 1.0 mg/dL   Nitrite NEGATIVE  NEGATIVE   Leukocytes, UA MODERATE (*) NEGATIVE  URINE MICROSCOPIC-ADD ON     Status: Abnormal   Collection Time   11/15/11 12:18 PM      Component Value Range   Squamous Epithelial / LPF FEW (*) RARE   WBC, UA 21-50  <3 WBC/hpf   RBC / HPF 11-20  <3 RBC/hpf   Bacteria, UA MANY (*) RARE   Urine-Other TRICHOMONAS PRESENT    POCT PREGNANCY, URINE     Status: Normal   Collection Time   11/15/11 12:24 PM      Component Value Range   Preg Test, Ur NEGATIVE  NEGATIVE  WET PREP, GENITAL     Status: Abnormal   Collection Time   11/15/11  1:00 PM      Component Value Range   Yeast Wet Prep HPF POC NONE SEEN  NONE SEEN   Trich, Wet Prep FEW (*) NONE SEEN   Clue Cells Wet Prep HPF POC NONE SEEN  NONE SEEN   WBC, Wet Prep HPF POC MANY (*) NONE SEEN  GLUCOSE, CAPILLARY     Status: Abnormal   Collection Time   11/15/11  1:05 PM      Component Value Range   Glucose-Capillary 117 (*) 70 - 99 mg/dL   Assessment: Trichomonas vaginosis  Plan:  Rx Flagyl   GC, Chlamydia cultures pending   Follow up with GYN Clinic   Procedures    Shawnice Tilmon 11/15/2011, 12:46 PM

## 2012-10-06 ENCOUNTER — Other Ambulatory Visit: Payer: Self-pay | Admitting: Internal Medicine

## 2012-10-06 DIAGNOSIS — I10 Essential (primary) hypertension: Secondary | ICD-10-CM

## 2012-10-10 ENCOUNTER — Encounter: Payer: Self-pay | Admitting: Internal Medicine

## 2012-10-10 NOTE — Telephone Encounter (Signed)
Please call patient for a follow up appointment for her blood pressure. She has not been seen in almost one year.   Thanks!  SK

## 2012-10-24 ENCOUNTER — Encounter: Payer: Self-pay | Admitting: Internal Medicine

## 2012-11-14 ENCOUNTER — Ambulatory Visit (INDEPENDENT_AMBULATORY_CARE_PROVIDER_SITE_OTHER): Payer: Self-pay | Admitting: Internal Medicine

## 2012-11-14 ENCOUNTER — Encounter: Payer: Self-pay | Admitting: Internal Medicine

## 2012-11-14 VITALS — BP 142/88 | HR 72 | Temp 97.4°F | Ht 67.0 in | Wt 227.1 lb

## 2012-11-14 DIAGNOSIS — D649 Anemia, unspecified: Secondary | ICD-10-CM

## 2012-11-14 DIAGNOSIS — R7309 Other abnormal glucose: Secondary | ICD-10-CM

## 2012-11-14 DIAGNOSIS — D509 Iron deficiency anemia, unspecified: Secondary | ICD-10-CM

## 2012-11-14 DIAGNOSIS — E669 Obesity, unspecified: Secondary | ICD-10-CM | POA: Insufficient documentation

## 2012-11-14 DIAGNOSIS — Z0000 Encounter for general adult medical examination without abnormal findings: Secondary | ICD-10-CM

## 2012-11-14 DIAGNOSIS — J309 Allergic rhinitis, unspecified: Secondary | ICD-10-CM

## 2012-11-14 DIAGNOSIS — D259 Leiomyoma of uterus, unspecified: Secondary | ICD-10-CM | POA: Insufficient documentation

## 2012-11-14 DIAGNOSIS — J302 Other seasonal allergic rhinitis: Secondary | ICD-10-CM

## 2012-11-14 DIAGNOSIS — R739 Hyperglycemia, unspecified: Secondary | ICD-10-CM

## 2012-11-14 DIAGNOSIS — I10 Essential (primary) hypertension: Secondary | ICD-10-CM

## 2012-11-14 HISTORY — DX: Iron deficiency anemia, unspecified: D50.9

## 2012-11-14 MED ORDER — HYDROCHLOROTHIAZIDE 12.5 MG PO CAPS: 12.5000 mg | ORAL_CAPSULE | Freq: Every day | ORAL | Status: DC

## 2012-11-14 MED ORDER — LISINOPRIL 20 MG PO TABS: 20.0000 mg | ORAL_TABLET | Freq: Every day | ORAL | Status: DC

## 2012-11-14 NOTE — Assessment & Plan Note (Signed)
-  Ordered Lipid panel.  -Ordered HgA1C for diabetes screening.  -Pt to make appointment with her OB/GYN for PAP smear. -Pt denies depression on depression screening.

## 2012-11-14 NOTE — Assessment & Plan Note (Signed)
She is very interested in weight loss and will start exercising soon. She has a friend to help her during her work-outs. Her diet is reported as healthy with no fried foods.

## 2012-11-14 NOTE — Assessment & Plan Note (Signed)
Hemoglobin in 11-12 range two years ago. Likely due to menorrhagia 2/2 uterine fibroids.  -CBC, TSH (will order fT4 as indicated)

## 2012-11-14 NOTE — Assessment & Plan Note (Signed)
She takes Allegra OTC as needed. No complaints during this visit.

## 2012-11-14 NOTE — Assessment & Plan Note (Signed)
BP Readings from Last 3 Encounters:  11/14/12 142/88  11/15/11 132/74  09/07/11 130/82    Lab Results  Component Value Date   NA 139 12/15/2010   K 3.6 12/15/2010   CREATININE 0.89 12/15/2010    Assessment: Blood pressure control:  mildly elevated Progress toward BP goal:   deteriorated Comments: Pt unable to afford current anti-hypertensives, has not taken them in 2.5 weeks.   Plan: Medications:  will start Lisinopril 20mg  daily (pt recalls taking this medicine in the past but with no reaction). HCTZ 12.5 mg daily. (discontinued Norvasc 5mg  daily, Triamterene-HCTZ 37.5-25mg  daily) Educational resources provided: brochure Self management tools provided: home blood pressure logbook Other plans: Will consider Lisinopril-HCTZ combo pill if she does well with Lisinopril. Of note, pt is s/p bilateral tubal ligation 2 years ago with no plan for reversal of this procedure for pregnancy planning.

## 2012-11-14 NOTE — Patient Instructions (Addendum)
General Instructions: -Follow up with your OB/GYN for PAP smear and for evaluation of heavy menstrual periods.  -Follow up with [place] in two weeks for blood pressure recheck. Your new blood pressure medications are Lisinopril 20mg  daily and Hydrochlorothiazide 12.5 mg daily.     Treatment Goals:  Goals (1 Years of Data) as of 11/14/12         As of Today 11/15/11 09/07/11     Blood Pressure    . Blood Pressure < 140/90  142/88 132/74 130/82      Progress Toward Treatment Goals:    Self Care Goals & Plans:  Self Care Goal 11/14/2012  Manage my medications take my medicines as prescribed; refill my medications on time  Monitor my health keep track of my blood pressure  Eat healthy foods eat foods that are low in salt; eat baked foods instead of fried foods  Be physically active find an activity I enjoy       Care Management & Community Referrals:

## 2012-11-14 NOTE — Assessment & Plan Note (Signed)
Pt reports being told in the past that she had small uterine fibroids. She explains that her periods are regular but are getting heavier with time.  -Pt encouraged to follow up with her OB/GYN for reevaluation of menorrhagia and uterine fibroids as well as for her yearly Pap smear. She will call and make the appointment.

## 2012-11-14 NOTE — Progress Notes (Signed)
  Subjective:    Patient ID: Nancy Silva, female    DOB: October 05, 1975, 37 y.o.   MRN: [id number]  HPI Ms. Murley is a pleasant 37 year old woman with no PMH of HTN, seasonal allergies, uterine fibroids, s/p bilateral tubal ligation, who comes in for follow up visit. She has been out of her HTN medications for 2.5 weeks now due to their high cost. She has had heavy menstrual cycles for years with anemia and was evaluated by her OB/GYN in the past and found to have small uterine fibroids. She tells me that her periods are heavier now and she is feeling more tired, irritable, with cold intolerance. She is very motivated to lose weight and has an exercise plan with easy access to a local gym and support from a friend. She had no other complaints.     Review of Systems  Constitutional: Positive for fatigue. Negative for fever, chills, activity change, appetite change and unexpected weight change.  Respiratory: Negative for cough and shortness of breath.   Cardiovascular: Negative for chest pain and leg swelling.  Gastrointestinal: Negative for abdominal pain and constipation.  Genitourinary: Positive for menstrual problem. Negative for vaginal bleeding and vaginal discharge.  Skin: Positive for pallor.  Neurological: Negative for dizziness and light-headedness.  Psychiatric/Behavioral: Negative for behavioral problems and agitation. The patient is nervous/anxious.        Objective:   Physical Exam  Nursing note and vitals reviewed. Constitutional: She is oriented to person, place, and time. She appears well-developed and well-nourished. No distress.  HENT:  Head: Atraumatic.  Eyes: Conjunctivae are normal. No scleral icterus.  Neck: Neck supple. No thyromegaly present.  Cardiovascular: Normal rate and regular rhythm.  Exam reveals no gallop and no friction rub.   Murmur heard. 2/6 Left upper sternal border flow murmur  Pulmonary/Chest: Effort normal and breath sounds normal. No  respiratory distress. She has no wheezes. She has no rales.  Abdominal: Soft. Bowel sounds are normal. She exhibits no distension. There is no tenderness.  Musculoskeletal: She exhibits no edema and no tenderness.  Neurological: She is alert and oriented to person, place, and time.  Skin: Skin is warm and dry. No rash noted. She is not diaphoretic. No erythema. There is pallor.  Psychiatric: She has a normal mood and affect. Her behavior is normal.          Assessment & Plan:

## 2012-11-15 LAB — CBC
HCT: 31.5 % — ABNORMAL LOW (ref 36.0–46.0)
MCV: 73.9 fL — ABNORMAL LOW (ref 78.0–100.0)
RBC: 4.26 MIL/uL (ref 3.87–5.11)
RDW: 17 % — ABNORMAL HIGH (ref 11.5–15.5)
WBC: 6.2 10*3/uL (ref 4.0–10.5)

## 2012-11-15 LAB — BASIC METABOLIC PANEL
BUN: 11 mg/dL (ref 6–23)
CO2: 29 mEq/L (ref 19–32)
Chloride: 102 mEq/L (ref 96–112)
Creat: 0.82 mg/dL (ref 0.50–1.10)

## 2012-11-15 NOTE — Progress Notes (Signed)
Case discussed with Dr. Kennerly at the time of the visit.  We reviewed the resident's history and exam and pertinent patient test results.  I agree with the assessment, diagnosis, and plan of care documented in the resident's note. 

## 2012-11-28 ENCOUNTER — Encounter: Payer: Self-pay | Admitting: Internal Medicine

## 2012-11-28 ENCOUNTER — Ambulatory Visit (INDEPENDENT_AMBULATORY_CARE_PROVIDER_SITE_OTHER): Payer: BC Managed Care – PPO | Admitting: Internal Medicine

## 2012-11-28 VITALS — BP 127/74 | HR 84 | Temp 97.2°F | Ht 66.5 in | Wt 224.2 lb

## 2012-11-28 DIAGNOSIS — Z91018 Allergy to other foods: Secondary | ICD-10-CM | POA: Insufficient documentation

## 2012-11-28 DIAGNOSIS — D649 Anemia, unspecified: Secondary | ICD-10-CM

## 2012-11-28 DIAGNOSIS — E669 Obesity, unspecified: Secondary | ICD-10-CM

## 2012-11-28 DIAGNOSIS — T781XXA Other adverse food reactions, not elsewhere classified, initial encounter: Secondary | ICD-10-CM

## 2012-11-28 DIAGNOSIS — K5900 Constipation, unspecified: Secondary | ICD-10-CM

## 2012-11-28 DIAGNOSIS — I10 Essential (primary) hypertension: Secondary | ICD-10-CM

## 2012-11-28 DIAGNOSIS — D500 Iron deficiency anemia secondary to blood loss (chronic): Secondary | ICD-10-CM

## 2012-11-28 DIAGNOSIS — N644 Mastodynia: Secondary | ICD-10-CM

## 2012-11-28 DIAGNOSIS — D259 Leiomyoma of uterus, unspecified: Secondary | ICD-10-CM

## 2012-11-28 MED ORDER — DOCUSATE SODIUM 100 MG PO CAPS: 100.0000 mg | ORAL_CAPSULE | Freq: Two times a day (BID) | ORAL | Status: DC

## 2012-11-28 MED ORDER — EPINEPHRINE 0.3 MG/0.3ML IJ SOAJ: 0.3000 mg | Freq: Once | INTRAMUSCULAR | Status: DC

## 2012-11-28 MED ORDER — FERROUS SULFATE 325 (65 FE) MG PO TBEC: 325.0000 mg | DELAYED_RELEASE_TABLET | Freq: Three times a day (TID) | ORAL | Status: DC

## 2012-11-28 MED ORDER — LISINOPRIL-HYDROCHLOROTHIAZIDE 20-12.5 MG PO TABS: 1.0000 | ORAL_TABLET | Freq: Every day | ORAL | Status: DC

## 2012-11-28 NOTE — Assessment & Plan Note (Signed)
BP Readings from Last 3 Encounters:  11/28/12 127/74  11/14/12 142/88  11/15/11 132/74    Lab Results  Component Value Date   NA 139 11/14/2012   K 4.1 11/14/2012   CREATININE 0.82 11/14/2012    Assessment: Blood pressure control: controlled Progress toward BP goal:  at goal Comments: Well controlled with HCTZ started during last visit and lisinopril.   Plan: Medications:  continue current medications, will combine lisinopril 20mg -HCTZ 12.5mg . Pt instructed to start taking combo pill only once she runs out of either lisinopril or HCTZ. Educational resources provided: Comptroller tools provided:   Other plans: Follow up in 3 months.

## 2012-11-28 NOTE — Assessment & Plan Note (Signed)
Pt encouraged to call and make an appt with her OB/GYN provider.

## 2012-11-28 NOTE — Progress Notes (Signed)
  Subjective:    Patient ID: Nancy Silva, female    DOB: Jun 04, 1975, 37 y.o.   MRN: [id number]  HPI Nancy Silva is a pleasant 37 year old woman with PMH of HTN, uterine fibroids, menorrhagia, and anemia who comes in for follow up visit for her HTN and anemia. She reports no dizziness with HCTZ which was started during her last visit. She will call her GYN providers for further evaluation of her menorrhagia and uterine fibroids.  She complains of breast tenderness today but she explains that she has this usually a few days before her period. Nonetheless, she is concerned about her risk of developing breast cancer as her mother died in her 1s (shortly after giving birth to the pt) due to ovarian cancer and her first cousin was diagnosed with breast cancer at age 67.   Review of Systems  Constitutional: Negative for fever, chills, activity change, appetite change and unexpected weight change.  Respiratory: Negative for cough and shortness of breath.   Cardiovascular: Negative for chest pain and leg swelling.  Gastrointestinal: Negative for constipation.  Endocrine: Positive for cold intolerance.  Musculoskeletal: Negative for back pain.  Skin: Positive for pallor. Negative for rash and wound.  Neurological: Negative for dizziness and light-headedness.  Psychiatric/Behavioral: Negative for behavioral problems and agitation.       Objective:   Physical Exam  Nursing note and vitals reviewed. Constitutional: She is oriented to person, place, and time. She appears well-developed. No distress.  Obese  HENT:  Head: Normocephalic and atraumatic.  Eyes: Conjunctivae are normal. No scleral icterus.  Cardiovascular: Normal rate and regular rhythm.   Murmur heard. Pulmonary/Chest: Effort normal. No respiratory distress. She has no wheezes. She has no rales.  Abdominal: Soft.  Musculoskeletal: She exhibits no edema.  Neurological: She is alert and oriented to person, place, and time.   Skin: Skin is warm and dry. She is not diaphoretic. There is pallor.  Psychiatric: She has a normal mood and affect. Her behavior is normal.          Assessment & Plan:

## 2012-11-28 NOTE — Assessment & Plan Note (Signed)
She has cut down on sodas and has lost 3 lbs since her last visit. I congratulated her and encouraged her to continue a healthy diet.

## 2012-11-28 NOTE — Patient Instructions (Addendum)
General Instructions: -You have a combined pill with lisinopril and hydrochlorothiazide. Start taking this combined medication once you run out of either lisinopril or hydrochlorothiazide.  -Start taking ferrous sulfate three times per day, preferably with your meals to prevent heart burn. Take Colace twice per day (or once per day depending on your body's response) to prevent constipation.  -Congratulations on losing 3 lbs! Continue avoiding sodas.  -Follow up with [place] in 3 months or sooner if you become sick.    Treatment Goals:  Goals (1 Years of Data) as of 11/28/12         As of Today 11/14/12 11/15/11     Blood Pressure    . Blood Pressure < 140/90  127/74 142/88 132/74      Progress Toward Treatment Goals:  Treatment Goal 11/28/2012  Blood pressure at goal    Self Care Goals & Plans:  Self Care Goal 11/28/2012  Manage my medications take my medicines as prescribed; bring my medications to every visit; refill my medications on time; follow the sick day instructions if I am sick  Monitor my health keep track of my blood pressure; keep track of my weight  Eat healthy foods eat more vegetables; eat fruit for snacks and desserts; eat baked foods instead of fried foods; eat smaller portions; eat foods that are low in salt  Be physically active take a walk every day; find an activity I enjoy  Meeting treatment goals maintain the current self-care plan       Care Management & Community Referrals:  Referral 11/28/2012  Referrals made for care management support none needed

## 2012-11-28 NOTE — Assessment & Plan Note (Signed)
Breast tenderness that is cyclic, related to her menstrual periods. Denies breast lumps, fullness, or mass. Given her family hx of ovarian cancer with death in 3s (mother) and breast cancer in her cousin (third-degree relative), will consider early breast mammogram screening. Pt advised to avoid caffeine to reduce breast tenderness and to note if breast tenderness does not coincide with her periods.  -She follow up with her OB/GYN soon, she will call and make an appointment.

## 2012-11-28 NOTE — Assessment & Plan Note (Addendum)
Rx for epi-pen 0.3mg /0.64ml SOAJ inj sent to her pharmacy per her request.

## 2012-11-28 NOTE — Progress Notes (Signed)
Case discussed with Dr. Garald Braver at the time of the visit.  We reviewed the resident's history and exam and pertinent patient test results.  I agree with the assessment, diagnosis, and plan of care documented in the resident's note. Recommend ferritin at next blood draw to verify the dx.

## 2012-11-28 NOTE — Assessment & Plan Note (Addendum)
Likely due to chronic blood loss from uterine fibroids. Hg lower to 9.8 compared to ~11 2 years ago.  Rx ferrous sulfate 325mg  TID w meals for at least three months Rx Colace 100mg  BID to prevent constipation with Fe tx.  Pt to follow up with GYN for further eval of her uterine fibroids, she will call them and make appt.  Pt to follow up with [place] in 3 months, will check Hg at that time and perhaps decrease Fe tx to once daily if Hg nl. Will consider checking iron and ferritin at that time.

## 2013-02-27 ENCOUNTER — Encounter: Payer: BC Managed Care – PPO | Admitting: Internal Medicine

## 2013-03-02 ENCOUNTER — Encounter (HOSPITAL_COMMUNITY): Payer: Self-pay | Admitting: Emergency Medicine

## 2013-03-02 ENCOUNTER — Other Ambulatory Visit: Payer: Self-pay

## 2013-03-02 ENCOUNTER — Emergency Department (HOSPITAL_COMMUNITY)
Admission: EM | Admit: 2013-03-02 | Discharge: 2013-03-02 | Disposition: A | Discharge status: Home / Self Care | Payer: BC Managed Care – PPO | Attending: Emergency Medicine | Admitting: Emergency Medicine

## 2013-03-02 ENCOUNTER — Emergency Department (HOSPITAL_COMMUNITY): Payer: BC Managed Care – PPO

## 2013-03-02 DIAGNOSIS — I10 Essential (primary) hypertension: Secondary | ICD-10-CM | POA: Insufficient documentation

## 2013-03-02 DIAGNOSIS — Z87891 Personal history of nicotine dependence: Secondary | ICD-10-CM | POA: Insufficient documentation

## 2013-03-02 DIAGNOSIS — R059 Cough, unspecified: Secondary | ICD-10-CM | POA: Insufficient documentation

## 2013-03-02 DIAGNOSIS — R0789 Other chest pain: Secondary | ICD-10-CM

## 2013-03-02 DIAGNOSIS — R071 Chest pain on breathing: Secondary | ICD-10-CM | POA: Insufficient documentation

## 2013-03-02 DIAGNOSIS — Z79899 Other long term (current) drug therapy: Secondary | ICD-10-CM | POA: Insufficient documentation

## 2013-03-02 DIAGNOSIS — R05 Cough: Secondary | ICD-10-CM | POA: Insufficient documentation

## 2013-03-02 LAB — CBC
MCH: 25 pg — ABNORMAL LOW (ref 26.0–34.0)
MCV: 77 fL — ABNORMAL LOW (ref 78.0–100.0)
Platelets: 383 10*3/uL (ref 150–400)
RDW: 16.6 % — ABNORMAL HIGH (ref 11.5–15.5)

## 2013-03-02 LAB — BASIC METABOLIC PANEL
Calcium: 10.1 mg/dL (ref 8.4–10.5)
Creatinine, Ser: 0.77 mg/dL (ref 0.50–1.10)
GFR calc Af Amer: >90 mL/min (ref >90)

## 2013-03-02 LAB — POCT I-STAT TROPONIN I: Troponin i, poc: 0 ng/mL (ref 0.00–0.08)

## 2013-03-02 NOTE — ED Notes (Signed)
Pt c/o left sided CP worse with movement and inspiration and movement x 1 week

## 2013-03-02 NOTE — ED Provider Notes (Signed)
CSN: [id number]     Arrival date & time 03/02/13  1732 History   First MD Initiated Contact with Patient 03/02/13 1847     Chief Complaint  Patient presents with  . Chest Pain   (Consider location/radiation/quality/duration/timing/severity/associated sxs/prior Treatment) The history is provided by the patient. No language interpreter was used.   Patient is a 37 year old African American female with past medical history of hypertension who comes emergency department today with a week of chest pain. The pain at the present the left side of her chest. She rates it at a 4/10 when she has. It is intermittent. It is worse with coughing, movement of her left arm, and getting up out of a chair. Have an exertional component to the chest pain. She does not have a pleuritic component to the chest pain. She's never had a DVT or PE. She does have a cough that is nonproductive. The cough began yesterday. She has no fevers, chills, or shortness of breath. No other complaints  Past Medical History  Diagnosis Date  . Hypertension    Past Surgical History  Procedure Laterality Date  . Cesarean section    . Tubal ligation  2011   Family History  Problem Relation Age of Onset  . Ovarian cancer Mother     Mother died in her 21s, shorlty the pt was born   . Breast cancer Cousin     diagnosed at age 82   History  Substance Use Topics  . Smoking status: Former Smoker    Types: Cigarettes    Quit date: 11/10/2007  . Smokeless tobacco: Never Used  . Alcohol Use: No   OB History   Grav Para Term Preterm Abortions TAB SAB Ect Mult Living   3 2 2  0 1 0 1 0 0 2     Review of Systems  Constitutional: Negative for fever and chills.  Respiratory: Negative for cough, chest tightness, shortness of breath and wheezing.   Cardiovascular: Positive for chest pain. Negative for palpitations and leg swelling.  Gastrointestinal: Negative for nausea, vomiting, abdominal pain, diarrhea and constipation.   Genitourinary: Negative for dysuria, urgency and frequency.  All other systems reviewed and are negative.    Allergies  Peanuts and Shellfish allergy  Home Medications   Current Outpatient Rx  Name  Route  Sig  Dispense  Refill  . acetaminophen (TYLENOL) 500 MG tablet   Oral   Take 1,000 mg by mouth daily as needed for mild pain.         Marland Kitchen EPINEPHrine (EPI-PEN) 0.3 mg/0.3 mL SOAJ injection   Intramuscular   Inject 0.3 mLs (0.3 mg total) into the muscle once.   1 Device   3   . ferrous sulfate 325 (65 FE) MG tablet   Oral   Take 325 mg by mouth daily with breakfast.         . ibuprofen (ADVIL,MOTRIN) 200 MG tablet   Oral   Take 400 mg by mouth daily as needed for moderate pain.         Marland Kitchen lisinopril-hydrochlorothiazide (PRINZIDE,ZESTORETIC) 20-12.5 MG per tablet   Oral   Take 1 tablet by mouth daily.          BP 144/78  Pulse 94  Temp(Src) 99.1 F (37.3 C) (Oral)  Resp 24  SpO2 99%  LMP 02/28/2013 Physical Exam  Nursing note and vitals reviewed. Constitutional: She is oriented to person, place, and time. She appears well-developed and well-nourished. No distress.  HENT:  Head: Normocephalic and atraumatic.  Eyes: Pupils are equal, round, and reactive to light.  Neck: Normal range of motion.  Cardiovascular: Normal rate, regular rhythm, normal heart sounds and intact distal pulses.   Pulmonary/Chest: Effort normal. No respiratory distress. She has no wheezes. She exhibits no tenderness.  Abdominal: Soft. Bowel sounds are normal. She exhibits no distension. There is no tenderness. There is no rebound and no guarding.  Neurological: She is alert and oriented to person, place, and time.  Skin: Skin is warm and dry.    ED Course  Procedures (including critical care time) Labs Review Labs Reviewed  CBC - Abnormal; Notable for the following:    Hemoglobin 11.3 (*)    HCT 34.8 (*)    MCV 77.0 (*)    MCH 25.0 (*)    RDW 16.6 (*)    All other components  within normal limits  BASIC METABOLIC PANEL - Abnormal; Notable for the following:    Glucose, Bld 107 (*)    All other components within normal limits  POCT I-STAT TROPONIN I   Imaging Review Dg Chest 2 View  03/02/2013   CLINICAL DATA:  Chest pain  EXAM: CHEST  2 VIEW  COMPARISON:  None.  FINDINGS: The heart size and mediastinal contours are within normal limits. Both lungs are clear. The visualized skeletal structures are unremarkable.  IMPRESSION: No active cardiopulmonary disease.   Electronically Signed   By: Alcide Clever M.D.   On: 03/02/2013 18:35    EKG Interpretation   None      Date: 03/02/2013  Rate: 98  Rhythm: normal sinus rhythm  QRS Axis: normal  Intervals: normal  ST/T Wave abnormalities: normal  Conduction Disutrbances:none  Narrative Interpretation:   Old EKG Reviewed: none available    MDM  Patient is a 37 year old African American female with past medical history of hypertension comes emergency department today with intermittent chest pain. Physical exam as above. With no exertional component to the pain doubt MI or ACS. Patient is PERC negative.  Doubt PE. History is consistent with a musculoskeletal chest pain with pain reproducible with movement and getting out of chairs. Initial workup included an i-STAT troponin, CBC, BMP, chest x-ray, and EKG. EKG as above. Chest x-ray with no cardiomegaly or disease. BMP unremarkable.  CBC unremarkable.  i-STAT troponin negative. Doubt pneumonia or pneumothorax. Patient was felt to be stable for discharge. She was instructed to followup with primary care physician as needed. She was instructed to return to the emergency department she develops worsening pain, shortness of breath, fevers, exertional pain, or any other concerns. Patient was instructed to utilize ibuprofen and Tylenol for the pain.  She was discharged in stable condition. Labs and imaging reviewed by myself and considered in medical decision-making. Imaging was  interpreted by radiology. Care was discussed my attending Dr. Jeraldine Loots.    1. Musculoskeletal chest pain        Bethann Berkshire, MD 03/02/13 [phone]

## 2013-03-03 NOTE — ED Provider Notes (Signed)
I have seen the patient with the resident physician, Dr. Craige Cotta.  The documentation is an accurate reflection of the patient's ED presentation, with the following additions:  On my exam this young female, healthy, was in no distress, sitting upright, speaking clearly, smiling.  I have seen EKG, agree with the interpretation. With prescription of pain for some time, there is low suspicion for ongoing coronary ischemia.  We discussed a trial of anti-inflammatories, outpatient followup, and the patient was discharged in stable condition.  Gerhard Munch, MD 03/03/13 [id number]

## 2013-03-27 ENCOUNTER — Encounter: Payer: BC Managed Care – PPO | Admitting: Internal Medicine

## 2013-03-27 ENCOUNTER — Encounter: Payer: Self-pay | Admitting: Internal Medicine

## 2013-03-31 ENCOUNTER — Encounter: Payer: Self-pay | Admitting: Internal Medicine

## 2013-04-12 ENCOUNTER — Other Ambulatory Visit: Payer: Self-pay | Admitting: Internal Medicine

## 2013-04-12 DIAGNOSIS — I10 Essential (primary) hypertension: Secondary | ICD-10-CM

## 2013-04-26 ENCOUNTER — Encounter: Payer: Self-pay | Admitting: Internal Medicine

## 2013-06-19 ENCOUNTER — Ambulatory Visit (INDEPENDENT_AMBULATORY_CARE_PROVIDER_SITE_OTHER): Payer: BC Managed Care – PPO | Admitting: Internal Medicine

## 2013-06-19 VITALS — BP 118/75 | HR 79 | Temp 97.5°F | Wt 230.6 lb

## 2013-06-19 DIAGNOSIS — D649 Anemia, unspecified: Secondary | ICD-10-CM

## 2013-06-19 DIAGNOSIS — T782XXA Anaphylactic shock, unspecified, initial encounter: Secondary | ICD-10-CM

## 2013-06-19 DIAGNOSIS — Z91018 Allergy to other foods: Secondary | ICD-10-CM

## 2013-06-19 DIAGNOSIS — N644 Mastodynia: Secondary | ICD-10-CM

## 2013-06-19 DIAGNOSIS — I10 Essential (primary) hypertension: Secondary | ICD-10-CM

## 2013-06-19 DIAGNOSIS — Z9109 Other allergy status, other than to drugs and biological substances: Secondary | ICD-10-CM

## 2013-06-19 DIAGNOSIS — E669 Obesity, unspecified: Secondary | ICD-10-CM

## 2013-06-19 DIAGNOSIS — E8881 Metabolic syndrome: Secondary | ICD-10-CM

## 2013-06-19 LAB — CBC WITH DIFFERENTIAL/PLATELET
BASOS PCT: 0 % (ref 0–1)
Basophils Absolute: 0 10*3/uL (ref 0.0–0.1)
EOS ABS: 0.2 10*3/uL (ref 0.0–0.7)
Eosinophils Relative: 3 % (ref 0–5)
HCT: 35.5 % — ABNORMAL LOW (ref 36.0–46.0)
HEMOGLOBIN: 11.9 g/dL — AB (ref 12.0–15.0)
LYMPHS ABS: 3.2 10*3/uL (ref 0.7–4.0)
Lymphocytes Relative: 39 % (ref 12–46)
MCH: 24.7 pg — AB (ref 26.0–34.0)
MCHC: 33.5 g/dL (ref 30.0–36.0)
MCV: 73.8 fL — ABNORMAL LOW (ref 78.0–100.0)
MONOS PCT: 7 % (ref 3–12)
Monocytes Absolute: 0.6 10*3/uL (ref 0.1–1.0)
NEUTROS ABS: 4.1 10*3/uL (ref 1.7–7.7)
NEUTROS PCT: 51 % (ref 43–77)
PLATELETS: 395 10*3/uL (ref 150–400)
RBC: 4.81 MIL/uL (ref 3.87–5.11)
RDW: 16.2 % — ABNORMAL HIGH (ref 11.5–15.5)
WBC: 8.1 10*3/uL (ref 4.0–10.5)

## 2013-06-19 LAB — IRON: IRON: 18 ug/dL — AB (ref 42–145)

## 2013-06-19 LAB — FERRITIN: FERRITIN: 26 ng/mL (ref 10–291)

## 2013-06-19 LAB — POCT GLYCOSYLATED HEMOGLOBIN (HGB A1C): HEMOGLOBIN A1C: 5.4

## 2013-06-19 LAB — GLUCOSE, CAPILLARY: GLUCOSE-CAPILLARY: 82 mg/dL (ref 70–99)

## 2013-06-19 MED ORDER — FERROUS SULFATE 325 (65 FE) MG PO TABS: 325.0000 mg | ORAL_TABLET | Freq: Every day | ORAL | Status: DC

## 2013-06-19 MED ORDER — EPINEPHRINE 0.3 MG/0.3ML IJ SOAJ: 0.3000 mg | Freq: Once | INTRAMUSCULAR | Status: AC

## 2013-06-19 MED ORDER — LISINOPRIL-HYDROCHLOROTHIAZIDE 20-12.5 MG PO TABS: 1.0000 | ORAL_TABLET | Freq: Every day | ORAL | Status: DC

## 2013-06-19 NOTE — Patient Instructions (Signed)
General Instructions: -Follow up with your GYN provider.  -Try cutting back on sodas to help you lose weight. Let us know if you are interested in meeting with our dietitian to further help you in your weight loss goals.  -Follow up with [place] in 3 months.  -It was great seeing you today!  Treatment Goals:  Goals (1 Years of Data) as of 06/19/13         As of Today 03/02/13 03/02/13 11/28/12 11/14/12     Blood Pressure    . Blood Pressure < 140/90  118/75 144/78 130/75 127/74 142/88      Progress Toward Treatment Goals:  Treatment Goal 06/19/2013  Blood pressure at goal    Self Care Goals & Plans:  Self Care Goal 06/19/2013  Manage my medications take my medicines as prescribed; refill my medications on time  Monitor my health keep track of my weight  Eat healthy foods eat foods that are low in salt; eat baked foods instead of fried foods; drink diet soda or water instead of juice or soda  Be physically active find an activity I enjoy; park at the far end of the parking lot  Meeting treatment goals -    No flowsheet data found.   Care Management & Community Referrals:  Referral 06/19/2013  Referrals made for care management support none needed

## 2013-06-21 ENCOUNTER — Encounter: Payer: Self-pay | Admitting: Internal Medicine

## 2013-06-21 NOTE — Assessment & Plan Note (Signed)
Rechecked CBC during this visit with improvement of Hg from 9.8 to 11.9. Iron level is low with microcytosis but Ferritin level is normal/low. Given that she continues to have menorrhagia (likely due to her uterine fibroids), she will need continue Fe therapy once daily (65mg  of elemental iron). She also needs to follow up with her GYN for evaluation of her uterine fibroids and treatment options.

## 2013-06-21 NOTE — Progress Notes (Signed)
INTERNAL MEDICINE TEACHING ATTENDING ADDENDUM - Rastus Borton, DO: I reviewed and discussed at the time of visit with the resident Dr. Kennerly, the patient's medical history, physical examination, diagnosis and results of tests and treatment and I agree with the patient's care as documented. 

## 2013-06-21 NOTE — Assessment & Plan Note (Signed)
She has cyclic breast tenderness with no lumps noted per her periodic self-breast exams, no nipple discharge, and no discoloration. She has follow up with GYN and was encouraged to discuss her family history of ovarian cancer and breast cancer (her first cousin) to guide the age to start screening her for breast cancer. The patient agrees with this plan.

## 2013-06-21 NOTE — Progress Notes (Signed)
   Subjective:    Patient ID: Nancy Silva, female    DOB: Jul 06, 1975, 38 y.o.   MRN: [id number]  HPI [name] is a pleasant 38 year old woman with PMH significant for menorrhagia,uterine fibroids, HTN, and iron deficiency anemia who presents for routine follow up visit. She states that her breast tenderness has improved and is noted to be cyclical, present only during the beginning of her cycle. She continues to have menorrhagia but has not been seen by GYN as of yet, she has an upcoming appointment in early March. She took iron TID with meals for two months after her last visit with me but is now taking it only once per day due to stomach upset with this treatment. She also is taking and OTC MV and reports improvement of her fatigue and pallor.  Her diet has worsened recently and she has gained ~6lbs. She is motivated to lose weight by cutting back on sodas.   Review of Systems  Constitutional: Negative for fever, chills, diaphoresis, activity change, appetite change, fatigue and unexpected weight change.  Respiratory: Negative for cough and shortness of breath.   Cardiovascular: Negative for chest pain, palpitations and leg swelling.  Gastrointestinal: Negative for nausea, abdominal pain and constipation.  Genitourinary: Negative for dysuria.  Skin: Negative for pallor.  Neurological: Negative for dizziness and light-headedness.  Psychiatric/Behavioral: Negative for behavioral problems and agitation.       Objective:   Physical Exam  Nursing note and vitals reviewed. Constitutional: She is oriented to person, place, and time. She appears well-developed. No distress.  Obese  Eyes: Conjunctivae are normal. No scleral icterus.  Cardiovascular: Normal rate and regular rhythm.   No murmur heard. Pulmonary/Chest: Effort normal and breath sounds normal. No respiratory distress. She has no wheezes. She has no rales.  Abdominal: Soft. There is no tenderness.  Musculoskeletal: She  exhibits no edema.  Neurological: She is alert and oriented to person, place, and time.  Skin: Skin is warm and dry. She is not diaphoretic. No pallor.  Psychiatric: She has a normal mood and affect. Her behavior is normal.          Assessment & Plan:

## 2013-06-21 NOTE — Assessment & Plan Note (Signed)
BP Readings from Last 3 Encounters:  06/19/13 118/75  03/02/13 144/78  11/28/12 127/74    Lab Results  Component Value Date   NA 139 03/02/2013   K 3.6 03/02/2013   CREATININE 0.77 03/02/2013    Assessment: Blood pressure control: controlled Progress toward BP goal:  at goal Comments: She is on prinzide, lisinopril 20mg -hctz 12.5mg  daily   Plan: Medications:  continue current medications Educational resources provided: brochure Self management tools provided:   Other plans: ACEi is teratogenic but pt's change of viable pregnancy are very low as she is s/p bl tubal ligation 3 years ago.

## 2013-06-21 NOTE — Assessment & Plan Note (Signed)
She declines referral to [facility] at this time and wants to lose weight on her own by cutting back sodas.

## 2013-06-21 NOTE — Assessment & Plan Note (Signed)
Provided another prescription for epi-pen.

## 2013-08-21 ENCOUNTER — Ambulatory Visit: Payer: BC Managed Care – PPO | Admitting: Internal Medicine

## 2013-08-21 ENCOUNTER — Encounter: Payer: Self-pay | Admitting: Internal Medicine

## 2013-10-02 ENCOUNTER — Encounter: Payer: Self-pay | Admitting: Internal Medicine

## 2013-10-02 ENCOUNTER — Ambulatory Visit (INDEPENDENT_AMBULATORY_CARE_PROVIDER_SITE_OTHER): Payer: BC Managed Care – PPO | Admitting: Internal Medicine

## 2013-10-02 VITALS — BP 125/81 | HR 114 | Temp 97.6°F | Wt 237.5 lb

## 2013-10-02 DIAGNOSIS — E669 Obesity, unspecified: Secondary | ICD-10-CM

## 2013-10-02 DIAGNOSIS — I10 Essential (primary) hypertension: Secondary | ICD-10-CM

## 2013-10-02 DIAGNOSIS — D649 Anemia, unspecified: Secondary | ICD-10-CM

## 2013-10-02 DIAGNOSIS — D259 Leiomyoma of uterus, unspecified: Secondary | ICD-10-CM

## 2013-10-02 MED ORDER — LISINOPRIL-HYDROCHLOROTHIAZIDE 20-12.5 MG PO TABS: 1.0000 | ORAL_TABLET | Freq: Every day | ORAL | Status: DC

## 2013-10-02 MED ORDER — FERROUS SULFATE 325 (65 FE) MG PO TABS: 325.0000 mg | ORAL_TABLET | Freq: Every day | ORAL | Status: DC

## 2013-10-02 NOTE — Patient Instructions (Signed)
General Instructions: -Continue taking your iron supplemets and multivitamin.  -Follow up with the health coach here in the clinic for your weight loss plan.  -Follow up with [place] in 3 months.    Thank you for bringing your medicines today. This helps [place] keep you safe from mistakes.   Progress Toward Treatment Goals:  Treatment Goal 10/02/2013  Blood pressure at goal    Self Care Goals & Plans:  Self Care Goal 10/02/2013  Manage my medications take my medicines as prescribed; refill my medications on time; bring my medications to every visit  Monitor my health keep track of my weight  Eat healthy foods eat foods that are low in salt; eat baked foods instead of fried foods  Be physically active find an activity I enjoy; park at the far end of the parking lot  Meeting treatment goals -    No flowsheet data found.   Care Management & Community Referrals:  Referral 10/02/2013  Referrals made for care management support none needed

## 2013-10-02 NOTE — Assessment & Plan Note (Signed)
With metromenorrhagia. She will consider the benefits v risks of HRT to reduce her menstrual bleeding thad so far has decreased her quality of life. She will return to her GYN provider if she is interested in starting HRT.

## 2013-10-02 NOTE — Progress Notes (Signed)
   Subjective:    Patient ID: Nancy Silva, female    DOB: [birth year], 38 y.o.   MRN: [id number]  HPI Nancy Silva is a pleasant 38 year old woman with PMH significant for metromenorrhagia, uterine fibroids, HTN, and iron deficiency anemia who presents for routine follow up visit. She continues to have menstrual periods that last for 7 days requiring super plus tampons and feminine pads that need to be changed 4 times daily. She usually stays home during her periods to prevent accidents. She has menstrual cramps that improve with Tylenol or Ibuprofen. Her cycle is regular, at 30 days. She has seen her Gynecologist for this, was told that she needed treatment with hormone replacement but she does not want to take another pill at this time. She has not had a recent US to confirm the present of uterine fibroids but tells me that her GYN provider thinks this is the most likely explanation for her metromenorrhagia.   She continues to take iron supplementation once daily and a MV with iron. She still has fatigue daily but denies SOB, heart palpitations, chest pain, or lightheadedness.   She has gained 7 lbs since I last saw her but she is very motivated to lose weight and would like a referral to the [facility] health coach.      Review of Systems  Constitutional: Positive for fatigue. Negative for fever, chills, diaphoresis, activity change, appetite change and unexpected weight change.  Respiratory: Negative for cough, shortness of breath and wheezing.   Cardiovascular: Negative for chest pain, palpitations and leg swelling.  Gastrointestinal: Negative for abdominal pain, diarrhea and constipation.  Genitourinary: Positive for menstrual problem. Negative for dysuria and vaginal discharge.  Skin: Negative for color change.  Neurological: Negative for dizziness, weakness, light-headedness and headaches.  Psychiatric/Behavioral: Negative for behavioral problems and agitation.       Objective:   Physical Exam  Nursing note and vitals reviewed. Constitutional: She is oriented to person, place, and time. She appears well-developed and well-nourished. No distress.  Obese  Cardiovascular: Normal rate and regular rhythm.   Pulmonary/Chest: Effort normal and breath sounds normal. No respiratory distress. She has no wheezes. She has no rales.  Abdominal: Soft. There is no tenderness.  Musculoskeletal: She exhibits no edema and no tenderness.  Neurological: She is alert and oriented to person, place, and time.  Skin: Skin is warm and dry. No rash noted. She is not diaphoretic. No erythema. There is pallor.  Psychiatric: She has a normal mood and affect. Her behavior is normal.          Assessment & Plan:

## 2013-10-02 NOTE — Assessment & Plan Note (Addendum)
BP Readings from Last 3 Encounters:  10/02/13 125/81  06/19/13 118/75  03/02/13 144/78    Lab Results  Component Value Date   NA 139 03/02/2013   K 3.6 03/02/2013   CREATININE 0.77 03/02/2013    Assessment: Blood pressure control: controlled Progress toward BP goal:  at goal Comments: She is on Lisinopril-HCTZ 20-12.5mg  daily.   Plan: Medications:  continue current medications Educational resources provided: brochure Self management tools provided: home blood pressure logbook Other plans: Refilled her Rx. Follow up in 3 months.

## 2013-10-02 NOTE — Assessment & Plan Note (Signed)
Etiology most likely her metromenorrhagia 2/2 uterine fibroids. She continues to take ferrous sulfate 325 (65 FE) once daily and a MV with iron.  She has fatigue that is stable. She was advised to go to the ED if she has signs of decompensated anemia such as SOB, lightheadedness, chest pain, or palpitations. She verbalized understanding.

## 2013-10-02 NOTE — Assessment & Plan Note (Signed)
Referred her to health coach. She is very motivated in losing weight.

## 2013-10-05 NOTE — Progress Notes (Signed)
Case discussed with Dr. Kennerly soon after the resident saw the patient.  We reviewed the resident's history and exam and pertinent patient test results.  I agree with the assessment, diagnosis, and plan of care documented in the resident's note. 

## 2013-10-31 ENCOUNTER — Encounter: Payer: BC Managed Care – PPO | Admitting: Dietician

## 2013-11-07 NOTE — Addendum Note (Signed)
Addended by: Hulan Fray on: 11/07/2013 04:56 PM   Modules accepted: Orders

## 2014-02-26 ENCOUNTER — Encounter: Payer: Self-pay | Admitting: Internal Medicine

## 2014-05-05 ENCOUNTER — Emergency Department (HOSPITAL_COMMUNITY)
Admission: EM | Admit: 2014-05-05 | Discharge: 2014-05-05 | Disposition: A | Discharge status: Home / Self Care | Payer: BLUE CROSS/BLUE SHIELD | Attending: Emergency Medicine | Admitting: Emergency Medicine

## 2014-05-05 ENCOUNTER — Emergency Department (HOSPITAL_COMMUNITY): Payer: BLUE CROSS/BLUE SHIELD

## 2014-05-05 ENCOUNTER — Encounter (HOSPITAL_COMMUNITY): Payer: Self-pay | Admitting: *Deleted

## 2014-05-05 DIAGNOSIS — Z87891 Personal history of nicotine dependence: Secondary | ICD-10-CM | POA: Insufficient documentation

## 2014-05-05 DIAGNOSIS — Z79899 Other long term (current) drug therapy: Secondary | ICD-10-CM | POA: Insufficient documentation

## 2014-05-05 DIAGNOSIS — R079 Chest pain, unspecified: Secondary | ICD-10-CM | POA: Diagnosis not present

## 2014-05-05 DIAGNOSIS — I10 Essential (primary) hypertension: Secondary | ICD-10-CM | POA: Diagnosis not present

## 2014-05-05 LAB — I-STAT TROPONIN, ED: TROPONIN I, POC: 0 ng/mL (ref 0.00–0.08)

## 2014-05-05 LAB — COMPREHENSIVE METABOLIC PANEL
ALT: 12 U/L (ref 0–35)
AST: 16 U/L (ref 0–37)
Albumin: 3.8 g/dL (ref 3.5–5.2)
Alkaline Phosphatase: 69 U/L (ref 39–117)
Anion gap: 5 (ref 5–15)
BILIRUBIN TOTAL: 0.4 mg/dL (ref 0.3–1.2)
BUN: 7 mg/dL (ref 6–23)
CHLORIDE: 105 meq/L (ref 96–112)
CO2: 29 mmol/L (ref 19–32)
Calcium: 9.3 mg/dL (ref 8.4–10.5)
Creatinine, Ser: 0.71 mg/dL (ref 0.50–1.10)
GFR calc Af Amer: >90 mL/min (ref >90)
GFR calc non Af Amer: >90 mL/min (ref >90)
Glucose, Bld: 83 mg/dL (ref 70–99)
Potassium: 4 mmol/L (ref 3.5–5.1)
Sodium: 139 mmol/L (ref 135–145)
Total Protein: 7.7 g/dL (ref 6.0–8.3)

## 2014-05-05 LAB — CBC
HEMATOCRIT: 32.8 % — AB (ref 36.0–46.0)
HEMOGLOBIN: 10.3 g/dL — AB (ref 12.0–15.0)
MCH: 23.5 pg — ABNORMAL LOW (ref 26.0–34.0)
MCHC: 31.4 g/dL (ref 30.0–36.0)
MCV: 74.9 fL — AB (ref 78.0–100.0)
Platelets: 394 10*3/uL (ref 150–400)
RBC: 4.38 MIL/uL (ref 3.87–5.11)
RDW: 16.1 % — ABNORMAL HIGH (ref 11.5–15.5)
WBC: 7.8 10*3/uL (ref 4.0–10.5)

## 2014-05-05 MED ORDER — OMEPRAZOLE 20 MG PO CPDR: 20.0000 mg | DELAYED_RELEASE_CAPSULE | Freq: Every day | ORAL | Status: DC

## 2014-05-05 NOTE — ED Notes (Signed)
Pts vitals updated. Pts IV removed. Pt getting dressed and awaiting discharge paperwork at bedside.

## 2014-05-05 NOTE — ED Provider Notes (Signed)
CSN: [id number]     Arrival date & time 05/05/14  1212 History   First MD Initiated Contact with Patient 05/05/14 1223     Chief Complaint  Patient presents with  . Chest Pain   HPI The patient presents to the emergency room with complaints of left-sided chest pain that started on Wednesday. Pain is moderate in severity. It feels like a pressure sensation. It has been constant since Wednesday. She has not had any trouble shortness of breath, nausea vomiting, fevers or cough. No recent trips or travel. No leg swelling.  Seems to get worse when she is lying flat. Because the symptoms persisted and were not going away she decided to come to the emergency room to make sure there was nothing serious going on.  She denies any history of heart disease or pulmonary embolism. No family history of heart disease. Past Medical History  Diagnosis Date  . Hypertension    Past Surgical History  Procedure Laterality Date  . Cesarean section    . Tubal ligation  2011   Family History  Problem Relation Age of Onset  . Ovarian cancer Mother     Mother died in her 55s, shorlty the pt was born   . Breast cancer Cousin     diagnosed at age 56   History  Substance Use Topics  . Smoking status: Former Smoker    Types: Cigarettes    Quit date: 11/10/2007  . Smokeless tobacco: Never Used  . Alcohol Use: No   OB History    Gravida Para Term Preterm AB TAB SAB Ectopic Multiple Living   3 2 2  0 1 0 1 0 0 2     Review of Systems  All other systems reviewed and are negative.     Allergies  Peanuts and Shellfish allergy  Home Medications   Prior to Admission medications   Medication Sig Start Date End Date Taking? Authorizing Provider  acetaminophen (TYLENOL) 500 MG tablet Take 1,000 mg by mouth daily as needed for mild pain.   Yes Historical Provider, MD  EPINEPHrine (EPI-PEN) 0.3 mg/0.3 mL SOAJ injection Inject 0.3 mLs (0.3 mg total) into the muscle once. 06/19/13  Yes Blain Pais, MD   ferrous sulfate 325 (65 FE) MG tablet Take 1 tablet (325 mg total) by mouth daily with breakfast. 10/02/13  Yes Blain Pais, MD  guaiFENesin (MUCINEX) 600 MG 12 hr tablet Take 1,200 mg by mouth 2 (two) times daily as needed for cough or to loosen phlegm.   Yes Historical Provider, MD  ibuprofen (ADVIL,MOTRIN) 200 MG tablet Take 400 mg by mouth daily as needed for moderate pain.   Yes Historical Provider, MD  lisinopril-hydrochlorothiazide (PRINZIDE,ZESTORETIC) 20-12.5 MG per tablet Take 1 tablet by mouth daily. 10/02/13  Yes Blain Pais, MD  omeprazole (PRILOSEC) 20 MG capsule Take 1 capsule (20 mg total) by mouth daily. 05/05/14   Dorie Rank, MD   BP 129/64 mmHg  Pulse 75  Temp(Src) 97.6 F (36.4 C) (Oral)  Resp 9  Ht 5\' 7"  (1.702 m)  Wt 225 lb (102.059 kg)  BMI 35.23 kg/m2  SpO2 99%  LMP 04/27/2014 Physical Exam  Constitutional: She appears well-developed and well-nourished. No distress.  HENT:  Head: Normocephalic and atraumatic.  Right Ear: External ear normal.  Left Ear: External ear normal.  Eyes: Conjunctivae are normal. Right eye exhibits no discharge. Left eye exhibits no discharge. No scleral icterus.  Neck: Neck supple. No tracheal deviation present.  Cardiovascular: Normal rate, regular rhythm and intact distal pulses.   Pulmonary/Chest: Effort normal and breath sounds normal. No stridor. No respiratory distress. She has no wheezes. She has no rales.  Abdominal: Soft. Bowel sounds are normal. She exhibits no distension. There is no tenderness. There is no rebound and no guarding.  Musculoskeletal: She exhibits no edema or tenderness.  Neurological: She is alert. She has normal strength. No cranial nerve deficit (no facial droop, extraocular movements intact, no slurred speech) or sensory deficit. She exhibits normal muscle tone. She displays no seizure activity. Coordination normal.  Skin: Skin is warm and dry. No rash noted.  Psychiatric: She has a normal mood  and affect.  Nursing note and vitals reviewed.   ED Course  Procedures (including critical care time) Labs Review Labs Reviewed  CBC - Abnormal; Notable for the following:    Hemoglobin 10.3 (*)    HCT 32.8 (*)    MCV 74.9 (*)    MCH 23.5 (*)    RDW 16.1 (*)    All other components within normal limits  COMPREHENSIVE METABOLIC PANEL  I-STAT TROPOININ, ED    Imaging Review Dg Chest 2 View  05/05/2014   CLINICAL DATA:  Left-sided chest pain x4 days  EXAM: CHEST  2 VIEW  COMPARISON:  03/02/2013  FINDINGS: Lungs are clear.  No pleural effusion or pneumothorax.  The heart is top-normal in size.  Visualized osseous structures are within normal limits.  IMPRESSION: No evidence of acute cardiopulmonary disease.   Electronically Signed   By: Julian Hy M.D.   On: 05/05/2014 13:50     EKG Interpretation   Date/Time:  Saturday May 05 2014 12:20:51 EST Ventricular Rate:  83 PR Interval:  144 QRS Duration: 84 QT Interval:  364 QTC Calculation: 428 R Axis:   39 Text Interpretation:  Sinus rhythm Baseline wander in lead(s) II III aVF  No significant change since last tracing Confirmed by Toy Eisemann  MD-J, Rony Ratz  ([id number]) on 05/05/2014 12:24:44 PM      MDM   Final diagnoses:  Chest pain, unspecified chest pain type    Patient is low risk for heart disease. She has had persistent symptoms for several days now and has a normal EKG as well as cardiac enzymes. I doubt cardiac etiology.  The patient has noticed a positional component.  It does get worse when she is supine. Possible this could be related to gastroesophageal reflux disease. We'll discharge her home on a course of antacids. Recommend follow-up with her primary care doctor.    Dorie Rank, MD 05/05/14 [phone]

## 2014-05-05 NOTE — ED Notes (Signed)
Pt remains monitored by blood pressure, pulse ox, and 5 lead.  

## 2014-05-05 NOTE — ED Notes (Signed)
Pt returned from xray

## 2014-05-05 NOTE — Discharge Instructions (Signed)

## 2014-05-05 NOTE — ED Notes (Signed)
Pt has hx of HTN.  Pt states her chest has felt pressure on left side since Wednesday with 6/10 pressure.  Denies SOB, LOC, N/V.  Pt states the pressure is worst when she lies down.

## 2014-05-05 NOTE — ED Notes (Signed)
Pt off floor with xray.

## 2014-05-07 ENCOUNTER — Encounter: Payer: Self-pay | Admitting: Internal Medicine

## 2014-05-07 ENCOUNTER — Ambulatory Visit (INDEPENDENT_AMBULATORY_CARE_PROVIDER_SITE_OTHER): Payer: BLUE CROSS/BLUE SHIELD | Admitting: Internal Medicine

## 2014-05-07 VITALS — BP 147/81 | HR 80 | Temp 97.5°F | Ht 67.0 in | Wt 244.3 lb

## 2014-05-07 DIAGNOSIS — I10 Essential (primary) hypertension: Secondary | ICD-10-CM | POA: Diagnosis not present

## 2014-05-07 DIAGNOSIS — K219 Gastro-esophageal reflux disease without esophagitis: Secondary | ICD-10-CM

## 2014-05-07 DIAGNOSIS — Z23 Encounter for immunization: Secondary | ICD-10-CM | POA: Diagnosis not present

## 2014-05-07 DIAGNOSIS — Z0000 Encounter for general adult medical examination without abnormal findings: Secondary | ICD-10-CM | POA: Diagnosis not present

## 2014-05-07 MED ORDER — LISINOPRIL-HYDROCHLOROTHIAZIDE 20-12.5 MG PO TABS: 1.0000 | ORAL_TABLET | Freq: Every day | ORAL | Status: DC

## 2014-05-07 NOTE — Patient Instructions (Signed)
General Instructions:   Please bring your medicines with you each time you come to clinic.  Medicines may include prescription medications, over-the-counter medications, herbal remedies, eye drops, vitamins, or other pills.   Progress Toward Treatment Goals:  Treatment Goal 10/02/2013  Blood pressure at goal    Self Care Goals & Plans:  Self Care Goal 05/07/2014  Manage my medications take my medicines as prescribed; bring my medications to every visit; refill my medications on time; follow the sick day instructions if I am sick  Monitor my health keep track of my blood pressure  Eat healthy foods eat more vegetables; eat fruit for snacks and desserts; eat foods that are low in salt; eat smaller portions  Be physically active find an activity I enjoy  Meeting treatment goals -    No flowsheet data found.   Care Management & Community Referrals:  Referral 10/02/2013  Referrals made for care management support none needed      Omeprazole tablets (OTC) What is this medicine? OMEPRAZOLE (oh ME pray zol) prevents the production of acid in the stomach. It is used to treat the symptoms of heartburn. You can buy this medicine without a prescription. This product is not for long-term use, unless otherwise directed by your doctor or health care professional. This medicine may be used for other purposes; ask your health care provider or pharmacist if you have questions. COMMON BRAND NAME(S): Prilosec OTC What should I tell my health care provider before I take this medicine? They need to know if you have any of these conditions: -black or bloody stools -chest pain -difficulty swallowing -have had heartburn for over 3 months -have heartburn with dizziness, lightheadedness or sweating -liver disease -stomach pain -unexplained weight loss -vomiting with blood -wheezing -an unusual or allergic reaction to omeprazole, other medicines, foods, dyes, or preservatives -pregnant or trying to  get pregnant -breast-feeding How should I use this medicine? Take this medicine by mouth. Follow the directions on the product label. If you are taking this medicine without a prescription, take one tablet every day. Do not use for longer than 14 days or repeat a course of treatment more often than every 4 months unless directed by a doctor or healthcare professional. Take your dose at regular intervals every 24 hours. Swallow the tablet whole with a drink of water. Do not crush, break or chew. This medicine works best if taken on an empty stomach 30 minutes before breakfast. If you are using this medicine with the prescription of your doctor or healthcare professional, follow the directions you were given. Do not take your medicine more often than directed. Talk to your pediatrician regarding the use of this medicine in children. Special care may be needed. Overdosage: If you think you have taken too much of this medicine contact a poison control center or emergency room at once. NOTE: This medicine is only for you. Do not share this medicine with others. What if I miss a dose? If you miss a dose, take it as soon as you can. If it is almost time for your next dose, take only that dose. Do not take double or extra doses. What may interact with this medicine? Do not take this medicine with any of the following medications: -atazanavir -clopidogrel -nelfinavir This medicine may also interact with the following medications: -ampicillin -certain medicines for anxiety or sleep -certain medicines that treat or prevent blood clots like warfarin -cyclosporine -diazepam -digoxin -disulfiram -iron salts -phenytoin -prescription medicine for fungal or  yeast infection like itraconazole, ketoconazole, voriconazole -saquinavir -tacrolimus This list may not describe all possible interactions. Give your health care provider a list of all the medicines, herbs, non-prescription drugs, or dietary supplements  you use. Also tell them if you smoke, drink alcohol, or use illegal drugs. Some items may interact with your medicine. What should I watch for while using this medicine? It can take several days before your heartburn gets better. Check with your doctor or health care professional if your condition does not start to get better, or if it gets worse. Do not treat diarrhea with over the counter products. Contact your doctor if you have diarrhea that lasts more than 2 days or if it is severe and watery. Do not treat yourself for heartburn with this medicine for more than 14 days in a row. You should only use this medicine for a 2-week treatment period once every 4 months. If your symptoms return shortly after your therapy is complete, or within the 4 month time frame, call your doctor or health care professional. What side effects may I notice from receiving this medicine? Side effects that you should report to your doctor or health care professional as soon as possible: -allergic reactions like skin rash, itching or hives, swelling of the face, lips, or tongue -bone, muscle or joint pain -breathing problems -chest pain or chest tightness -dark yellow or brown urine -diarrhea -dizziness -fast, irregular heartbeat -feeling faint or lightheaded -fever or sore throat -muscle spasm -palpitations -redness, blistering, peeling or loosening of the skin, including inside the mouth -seizures -tremors -unusual bleeding or bruising -unusually weak or tired -yellowing of the eyes or skin Side effects that usually do not require medical attention (Report these to your doctor or health care professional if they continue or are bothersome.): -constipation -dry mouth -headache -loose stools -nausea This list may not describe all possible side effects. Call your doctor for medical advice about side effects. You may report side effects to FDA at 1-800-FDA-1088. Where should I keep my medicine? Keep out of  the reach of children. Store at room temperature between 20 and 25 degrees C (68 and 77 degrees F). Protect from light and moisture. Throw away any unused medicine after the expiration date. NOTE: This sheet is a summary. It may not cover all possible information. If you have questions about this medicine, talk to your doctor, pharmacist, or health care provider.  2015, [employer]vier/Gold Standard. (2011-01-12 11:40:25)

## 2014-05-07 NOTE — Progress Notes (Signed)
[facility] Subjective:   Patient ID: Nancy Silva female   DOB: 1976/02/19 39 y.o.   MRN: [id number]  HPI: [name] is a 39 y.o. female with a PMH of HTN who presents for ER follow up.  She was seen in the ED on 05/05/14 for chest pain.  EKG was wnl and initial troponin negative.  EDP felt pain unlikely cardiac and likely GERD related and Rxed Omeprazole.  Patient has yet to pick up this Rx. She report that she first noticed her chest pain on Wednesday of last week.  She reports that it got worse until Saturday which prompted her to get to the ED.  She notes that the pain in worse with laying down.  She does report a history of heartburn especially during her pregnancies.  She notes the pain is not related to exertion or relived with rest.  She has had no trauma to her chest.  She denies any SOB.  She denies any significant anxiety or new stressors.  She denies any rash.   She has not family history of early cardiac death.  Past Medical History  Diagnosis Date  . Hypertension    Current Outpatient Prescriptions  Medication Sig Dispense Refill  . acetaminophen (TYLENOL) 500 MG tablet Take 1,000 mg by mouth daily as needed for mild pain.    Marland Kitchen EPINEPHrine (EPI-PEN) 0.3 mg/0.3 mL SOAJ injection Inject 0.3 mLs (0.3 mg total) into the muscle once. 1 Device 3  . ferrous sulfate 325 (65 FE) MG tablet Take 1 tablet (325 mg total) by mouth daily with breakfast. 30 tablet 6  . guaiFENesin (MUCINEX) 600 MG 12 hr tablet Take 1,200 mg by mouth 2 (two) times daily as needed for cough or to loosen phlegm.    Marland Kitchen ibuprofen (ADVIL,MOTRIN) 200 MG tablet Take 400 mg by mouth daily as needed for moderate pain.    Marland Kitchen lisinopril-hydrochlorothiazide (PRINZIDE,ZESTORETIC) 20-12.5 MG per tablet Take 1 tablet by mouth daily. 30 tablet 6  . omeprazole (PRILOSEC) 20 MG capsule Take 1 capsule (20 mg total) by mouth daily. 20 capsule 0   No current facility-administered medications  for this visit.   Family History  Problem Relation Age of Onset  . Ovarian cancer Mother     Mother died in her 27s, shorlty the pt was born   . Breast cancer Cousin     diagnosed at age 65   History   Social History  . Marital Status: Single    Spouse Name: N/A    Number of Children: N/A  . Years of Education: N/A   Social History Main Topics  . Smoking status: Former Smoker    Types: Cigarettes    Quit date: 11/10/2007  . Smokeless tobacco: Never Used  . Alcohol Use: No  . Drug Use: No  . Sexual Activity: Yes    Birth Control/ Protection: None   Other Topics Concern  . None   Social History Narrative   Review of Systems: Review of Systems  Constitutional: Negative for fever, chills, weight loss and malaise/fatigue.  Eyes: Negative for blurred vision.  Respiratory: Negative for cough and shortness of breath.   Cardiovascular: Positive for chest pain. Negative for leg swelling.  Gastrointestinal: Negative for nausea, vomiting, abdominal pain, diarrhea and constipation.  Genitourinary: Negative for dysuria.  Musculoskeletal: Negative for myalgias.  Neurological: Negative for headaches.  Psychiatric/Behavioral: Negative for depression.    Objective:  Physical Exam: Filed Vitals:   05/07/14 1503  BP:  147/81  Pulse: 80  Temp: 97.5 F (36.4 C)  TempSrc: Oral  Height: 5\' 7"  (1.702 m)  Weight: 244 lb 4.8 oz (110.814 kg)  SpO2: 100%  Physical Exam  Constitutional: She is well-developed, well-nourished, and in no distress.  Obese  Cardiovascular: Normal rate, regular rhythm and normal heart sounds.   Pulmonary/Chest: Effort normal and breath sounds normal.  Abdominal: Soft. Bowel sounds are normal. There is no tenderness.  Musculoskeletal: She exhibits no edema.  Nursing note and vitals reviewed.   Assessment & Plan:  Case discussed with Dr. Ellwood Dense  GERD (gastroesophageal reflux disease) Patient's chest pain is mostly likely due to underlying GERD.  She  does note that she has had significant weight gain and the pain is worsening with laying down.  Her only risk factor for CAD is HTN making her low risk for CAD.  Will continue with EDP plan for trial of treatment with PPI.  I discussed this with her and explain the medication, I also suggested she could try OTC tums for more rapid relief.  Essential hypertension BP Readings from Last 3 Encounters:  05/07/14 147/81  05/05/14 126/65  10/02/13 125/81    Lab Results  Component Value Date   NA 139 05/05/2014   K 4.0 05/05/2014   CREATININE 0.71 05/05/2014    Assessment: Blood pressure control:  mildly elevated Progress toward BP goal:   deteriorated Comments: ran out of medication x 2 days  Plan: Medications:  Lisinopril HCTZ 20-12.5mg  Educational resources provided: brochure, handout, video Self management tools provided:   Other plans:    Healthcare maintenance - Flu shot given today.    Medications Ordered No orders of the defined types were placed in this encounter.   Other Orders No orders of the defined types were placed in this encounter.

## 2014-05-07 NOTE — Progress Notes (Signed)
Internal Medicine Clinic Attending  Case discussed with Dr. Hoffman soon after the resident saw the patient.  We reviewed the resident's history and exam and pertinent patient test results.  I agree with the assessment, diagnosis, and plan of care documented in the resident's note. 

## 2014-05-08 NOTE — Assessment & Plan Note (Signed)
Patient's chest pain is mostly likely due to underlying GERD.  She does note that she has had significant weight gain and the pain is worsening with laying down.  Her only risk factor for CAD is HTN making her low risk for CAD.  Will continue with EDP plan for trial of treatment with PPI.  I discussed this with her and explain the medication, I also suggested she could try OTC tums for more rapid relief.

## 2014-05-08 NOTE — Assessment & Plan Note (Signed)
BP Readings from Last 3 Encounters:  05/07/14 147/81  05/05/14 126/65  10/02/13 125/81    Lab Results  Component Value Date   NA 139 05/05/2014   K 4.0 05/05/2014   CREATININE 0.71 05/05/2014    Assessment: Blood pressure control:  mildly elevated Progress toward BP goal:   deteriorated Comments: ran out of medication x 2 days  Plan: Medications:  Lisinopril HCTZ 20-12.5mg  Educational resources provided: brochure, handout, video Self management tools provided:   Other plans:

## 2014-05-08 NOTE — Assessment & Plan Note (Signed)
Flu shot given today

## 2014-07-05 ENCOUNTER — Ambulatory Visit (INDEPENDENT_AMBULATORY_CARE_PROVIDER_SITE_OTHER): Payer: BLUE CROSS/BLUE SHIELD | Admitting: Internal Medicine

## 2014-07-05 ENCOUNTER — Encounter: Payer: Self-pay | Admitting: Internal Medicine

## 2014-07-05 VITALS — BP 141/92 | HR 92 | Temp 97.8°F | Ht 67.0 in | Wt 236.6 lb

## 2014-07-05 DIAGNOSIS — I10 Essential (primary) hypertension: Secondary | ICD-10-CM

## 2014-07-05 DIAGNOSIS — J069 Acute upper respiratory infection, unspecified: Secondary | ICD-10-CM | POA: Insufficient documentation

## 2014-07-05 NOTE — Patient Instructions (Signed)
General Instructions: -Start taking Zyrtec 10mg  daily for allergies. You may also try Flonase nasal spray.  -Start using saline nasal spray 4 times per day for the nasal congestion. Avoid decongestants as they may increase your blood pressure.  -Continue drinking lemon tea to soothe the sorethroat.  -Follow up with [place] if your symptoms do not improve in 1 week.   Please bring your medicines with you each time you come to clinic.  Medicines may include prescription medications, over-the-counter medications, herbal remedies, eye drops, vitamins, or other pills.   Progress Toward Treatment Goals:  Treatment Goal 10/02/2013  Blood pressure at goal    Self Care Goals & Plans:  Self Care Goal 07/05/2014  Manage my medications take my medicines as prescribed; bring my medications to every visit; refill my medications on time; follow the sick day instructions if I am sick  Monitor my health keep track of my blood pressure  Eat healthy foods eat more vegetables; eat fruit for snacks and desserts; eat baked foods instead of fried foods; eat foods that are low in salt; eat smaller portions  Be physically active find an activity I enjoy  Meeting treatment goals -    No flowsheet data found.   Care Management & Community Referrals:  Referral 10/02/2013  Referrals made for care management support none needed

## 2014-07-05 NOTE — Progress Notes (Signed)
   Subjective:    Patient ID: Nancy Silva, female    DOB: 06-05-1975, 39 y.o.   MRN: [id number]  HPI Nancy Silva is a 39 yr old woman with PMH of HTN, GERD, seasonal allergies, who presents for evaluation of sorethroat x1 week accompanied by a dry cough to scantly productive of clear sputum, and intermittent right ear pain with sensation of being "under the water", sinus pressure, rhinorrhea with clear discharge and postnasal drip. She felt hot/cold last week but has had no fever this week. Denies sick contacts. Has been outdoors and exposed to "alot of pollen".  Her appetite is slightly decreased but she is tolerating intake per mouth well. She has taken Tylenol sinus/cold and Alka-Seltzer plus with no much improvement of her symptoms. Salt water gargle and lemon tea have helped alleviated her sore throat.     Review of Systems  Constitutional: Negative for fever, chills, diaphoresis, activity change, appetite change, fatigue and unexpected weight change.  HENT: Positive for congestion, ear pain, postnasal drip, rhinorrhea, sinus pressure and sore throat. Negative for ear discharge, hearing loss and voice change.   Eyes: Negative for pain, discharge and itching.  Respiratory: Positive for cough. Negative for shortness of breath and wheezing.   Cardiovascular: Negative for chest pain, palpitations and leg swelling.  Gastrointestinal: Negative for abdominal pain, diarrhea and constipation.  Genitourinary: Negative for dysuria and difficulty urinating.  Musculoskeletal: Negative for myalgias.  Skin: Negative for rash.  Neurological: Negative for dizziness and light-headedness.  Psychiatric/Behavioral: Negative for agitation.       Objective:   Physical Exam  Constitutional: She is oriented to person, place, and time. She appears well-developed and well-nourished. No distress.  HENT:  Right TM visualized with normal appearance, normal light reflection no ear canal erythema.  Left TM  partially visualized due to cerumen build up, no ear canal erythema.  Mild tonsillar enlargement bilaterally with no exudates.  Pale nasal turbinates bilaterally   Eyes: Conjunctivae are normal. Right eye exhibits no discharge. Left eye exhibits no discharge. No scleral icterus.  Cardiovascular: Normal rate.   Pulmonary/Chest: Effort normal and breath sounds normal. No respiratory distress. She has no wheezes. She has no rales.  Abdominal: Soft. There is no tenderness.  Lymphadenopathy:    She has no cervical adenopathy.  Neurological: She is alert and oriented to person, place, and time. Coordination normal.  Skin: Skin is warm and dry. She is not diaphoretic.  Psychiatric: She has a normal mood and affect.  Nursing note and vitals reviewed.         Assessment & Plan:

## 2014-07-05 NOTE — Assessment & Plan Note (Signed)
Likely due to her seasonal allergies and recent exposure to pollen, viral etiology also possible. She has had no recent fever, clear discharge making bacterial sinusitis less likely.   -Rx Zyrtec 10mg  daily. She may also try Flonase nasal spray OTC if needed.  -Saline nasal spray 4 times daily for nasal congestion.  -Continue lemon tea for sorethroat and cough.  -Follow up with [place] as needed if her symptoms do not improve in 1 week.

## 2014-07-05 NOTE — Assessment & Plan Note (Signed)
BP Readings from Last 3 Encounters:  07/05/14 141/92  05/07/14 147/81  05/05/14 126/65    Lab Results  Component Value Date   NA 139 05/05/2014   K 4.0 05/05/2014   CREATININE 0.71 05/05/2014    Assessment: Blood pressure control:  controlled Progress toward BP goal:   at goal Comments: She is on lisinopril-HCTZ 20-12.5mg  daily  Plan: Medications:  continue current medications Educational resources provided: brochure, handout, video Self management tools provided:   Other plans: Follow up in 6 months.

## 2014-07-05 NOTE — Progress Notes (Signed)
Case discussed with Dr. Kennerly at the time of the visit.  We reviewed the resident's history and exam and pertinent patient test results.  I agree with the assessment, diagnosis, and plan of care documented in the resident's note. 

## 2014-07-10 ENCOUNTER — Ambulatory Visit: Payer: BLUE CROSS/BLUE SHIELD | Admitting: Internal Medicine

## 2014-09-07 ENCOUNTER — Encounter: Payer: Self-pay | Admitting: *Deleted

## 2015-10-14 ENCOUNTER — Encounter: Payer: Self-pay | Admitting: Internal Medicine

## 2015-10-14 ENCOUNTER — Ambulatory Visit (HOSPITAL_COMMUNITY)
Admission: RE | Admit: 2015-10-14 | Discharge: 2015-10-14 | Disposition: A | Discharge status: Home / Self Care | Payer: Self-pay | Source: Ambulatory Visit | Attending: Internal Medicine | Admitting: Internal Medicine

## 2015-10-14 ENCOUNTER — Ambulatory Visit (INDEPENDENT_AMBULATORY_CARE_PROVIDER_SITE_OTHER): Payer: Self-pay | Admitting: Internal Medicine

## 2015-10-14 ENCOUNTER — Telehealth: Payer: Self-pay | Admitting: *Deleted

## 2015-10-14 VITALS — BP 155/93 | HR 98 | Temp 98.3°F | Ht 67.0 in | Wt 246.0 lb

## 2015-10-14 DIAGNOSIS — I10 Essential (primary) hypertension: Secondary | ICD-10-CM

## 2015-10-14 DIAGNOSIS — Z8249 Family history of ischemic heart disease and other diseases of the circulatory system: Secondary | ICD-10-CM

## 2015-10-14 DIAGNOSIS — I208 Other forms of angina pectoris: Secondary | ICD-10-CM

## 2015-10-14 DIAGNOSIS — Z029 Encounter for administrative examinations, unspecified: Secondary | ICD-10-CM | POA: Insufficient documentation

## 2015-10-14 MED ORDER — RANITIDINE HCL 300 MG PO TABS: 300.0000 mg | ORAL_TABLET | Freq: Every day | ORAL | Status: DC

## 2015-10-14 MED ORDER — SUCRALFATE 1 G PO TABS
1.0000 g | ORAL_TABLET | Freq: Four times a day (QID) | ORAL | Status: DC
Start: 2015-10-14 — End: 2016-11-06

## 2015-10-14 MED ORDER — HYDROCHLOROTHIAZIDE 12.5 MG PO TABS: 12.5000 mg | ORAL_TABLET | Freq: Every day | ORAL | Status: DC

## 2015-10-14 NOTE — Progress Notes (Signed)
Patient ID: Nancy Silva, female   DOB: [birth year], 40 y.o.   MRN: [id number] [facility] Subjective:   Patient ID: Nancy Silva female   DOB: [birth year] 40 y.o.   MRN: [id number]  HPI: [name] is a 40 y.o. female with hypertension and gastroesophageal reflux disease here with chest pain.  Chest pain: Last Friday, she felt an acute-onset 3/10 substernal chest pressure after eating pizza. The pain has persisted and slightly worsened to 5/10 since that time; it is constant but worse with lying flat and walking, and was not relieved by ibuprofen. She denies any associated dyspnea, nausea, radiation, nor diaphoresis. Her sister had a heart attack when she was 75, she personally smoked 0.5 packs of cigarettes daily for 2.5 years but quit 10 years ago, and she has hypertension but has not been taking her antihypertensives for the last year. She has felt dyspepsia in the past but this feels different. She had been taking omeprazole but has not been taking an antacid for the last year. She has gained 20lbs in the last year. She denies any melena, hematochezia, epigastric abdominal pain, nor lightheadedness. She further denies any rash, tenderness to palpation, pleuritic component, hemoptysis, lower extremity pain or edema, cough, or fevers.  I have reviewed her medications with her today.  Review of Systems  Constitutional: Negative for fever, chills, weight loss, malaise/fatigue and diaphoresis.  HENT: Negative for congestion.   Eyes: Negative for blurred vision.  Respiratory: Negative for cough, shortness of breath and wheezing.   Cardiovascular: Positive for chest pain. Negative for palpitations, orthopnea, leg swelling and PND.  Gastrointestinal: Negative for heartburn, nausea, vomiting, abdominal pain, diarrhea, blood in stool and melena.  Musculoskeletal: Negative for myalgias, joint pain and falls.  Skin: Negative for rash.  Neurological: Positive for  headaches. Negative for dizziness, focal weakness and loss of consciousness.  Psychiatric/Behavioral: Negative for depression. The patient is not nervous/anxious.    Objective:  Physical Exam: Filed Vitals:   10/14/15 1428  BP: 155/93  Pulse: 98  Temp: 98.3 F (36.8 C)  TempSrc: Oral  Height: 5\' 7"  (1.702 m)  Weight: 246 lb (111.585 kg)  SpO2: 100%   General: well-appearing lady resting in chair comfortably, appropriately conversational HEENT: no scleral icterus, extra-ocular muscles intact, oropharynx without lesions Cardiac: regular rate and rhythm, no rubs, murmurs or gallops, chest non-tender to palpation Pulm: breathing well, clear to auscultation bilaterally Abd: bowel sounds normal, soft, nondistended, non-tender Ext: warm and well perfused, without pedal edema Lymph: no cervical or supraclavicular lymphadenopathy Skin: no rash, hair, or nail changes Neuro: alert and oriented X3, cranial nerves II-XII grossly intact, moving all extremities well  Assessment & Plan:  Case discussed with Dr. Lynnae January  Atypical angina [facility]) Her symptoms sound most consistent with gastroesophageal reflux disease as evidenced by onset after eating pizza worsening with lying flat and , but there are minor cardiac features as well such as subtle exertional angina, and a sister who had a myocardial infarction at 40 years old. Her EKG today was reassuringly normal and unchanged from January 2016.   Today we will treat this as gastroesophageal reflux with a daily H2 blocker (this is the only antacid on the [facility] $4 list) and sucralfate for two weeks. If she doesn't improve, I think we should re-evaluate and either increase therapy to a twice-daily PPI or consider an outpatient treadmill stress test to rule out coronary artery disease.  Essential hypertension She was hypertensive today to 155/93 on no  antihypertensives. She is concerned about costs, so we have started HCTZ 12.5mg  daily. She will follow-up  in 1 month. If she remains above-goal of 140/90, we can double the dose of HCTZ.   Medications Ordered Meds ordered this encounter  Medications  . hydrochlorothiazide (HYDRODIURIL) 12.5 MG tablet    Sig: Take 1 tablet (12.5 mg total) by mouth daily.    Dispense:  30 tablet    Refill:  0  . ranitidine (ZANTAC) 300 MG tablet    Sig: Take 1 tablet (300 mg total) by mouth at bedtime.    Dispense:  30 tablet    Refill:  0  . sucralfate (CARAFATE) 1 g tablet    Sig: Take 1 tablet (1 g total) by mouth 4 (four) times daily.    Dispense:  120 tablet    Refill:  1   Other Orders Orders Placed This Encounter  Procedures  . EKG 12-Lead   Follow Up: Return in about 4 weeks (around 11/11/2015) for blood pressure followup.

## 2015-10-14 NOTE — Assessment & Plan Note (Signed)
She was hypertensive today to 155/93 on no antihypertensives. She is concerned about costs, so we have started HCTZ 12.5mg  daily. She will follow-up in 1 month. If she remains above-goal of 140/90, we can double the dose of HCTZ.

## 2015-10-14 NOTE — Telephone Encounter (Signed)
Pt calls and states since Friday when going to bed she develops discomfort between her breasts, "it feels deep", slight h/a, denies shortness of breath, N&V, weakness. She states she would really like to wait and come to office tomorrow for a visit. She is ask to go to ED and states she needs to stay at work, she is offered an appt this pm at 1415 and reluctantly  Agrees, she is ask if she becomes short of breath, N&V, weak, chest discomfort becomes worse that she call 911 immediately, she agrees. appt 1415 dr Melburn Hake

## 2015-10-14 NOTE — Assessment & Plan Note (Signed)
Her symptoms sound most consistent with gastroesophageal reflux disease as evidenced by onset after eating pizza worsening with lying flat and , but there are minor cardiac features as well such as subtle exertional angina, and a sister who had a myocardial infarction at 41 years old. Her EKG today was reassuringly normal and unchanged from January 2016.   Today we will treat this as gastroesophageal reflux with a daily H2 blocker (this is the only antacid on the [facility] $4 list) and sucralfate for two weeks. If she doesn't improve, I think we should re-evaluate and either increase therapy to a twice-daily PPI or consider an outpatient treadmill stress test to rule out coronary artery disease.

## 2015-10-15 ENCOUNTER — Encounter: Payer: Self-pay | Admitting: *Deleted

## 2015-10-15 ENCOUNTER — Telehealth: Payer: Self-pay | Admitting: Internal Medicine

## 2015-10-15 NOTE — Telephone Encounter (Signed)
Pt would like a letter for work yesterdays appt  Needs it faxed to [phone] Nancy Silva

## 2015-10-15 NOTE — Progress Notes (Signed)
Internal Medicine Clinic Attending  Case discussed with Dr. Flores at the time of the visit.  We reviewed the resident's history and exam and pertinent patient test results.  I agree with the assessment, diagnosis, and plan of care documented in the resident's note. 

## 2015-11-04 ENCOUNTER — Encounter: Payer: Self-pay | Admitting: Internal Medicine

## 2015-11-04 NOTE — Telephone Encounter (Signed)
Spoke with patient who states she never received this letter. Could you write one for her and I will fax it? Thanks!

## 2015-11-04 NOTE — Telephone Encounter (Signed)
New letter written.  Will bring to [place].  Thanks

## 2015-11-04 NOTE — Telephone Encounter (Signed)
LVM to return call.

## 2015-11-04 NOTE — Telephone Encounter (Signed)
Faxed. Thanks.

## 2016-01-01 ENCOUNTER — Emergency Department (HOSPITAL_COMMUNITY): Payer: Self-pay

## 2016-01-01 ENCOUNTER — Encounter (HOSPITAL_COMMUNITY): Payer: Self-pay | Admitting: Emergency Medicine

## 2016-01-01 ENCOUNTER — Emergency Department (HOSPITAL_COMMUNITY)
Admission: EM | Admit: 2016-01-01 | Discharge: 2016-01-01 | Disposition: A | Discharge status: Home / Self Care | Payer: Self-pay | Attending: Emergency Medicine | Admitting: Emergency Medicine

## 2016-01-01 DIAGNOSIS — S93402A Sprain of unspecified ligament of left ankle, initial encounter: Secondary | ICD-10-CM | POA: Insufficient documentation

## 2016-01-01 DIAGNOSIS — Z87891 Personal history of nicotine dependence: Secondary | ICD-10-CM | POA: Insufficient documentation

## 2016-01-01 DIAGNOSIS — Y92194 Driveway of other specified residential institution as the place of occurrence of the external cause: Secondary | ICD-10-CM | POA: Insufficient documentation

## 2016-01-01 DIAGNOSIS — Y999 Unspecified external cause status: Secondary | ICD-10-CM | POA: Insufficient documentation

## 2016-01-01 DIAGNOSIS — X501XXA Overexertion from prolonged static or awkward postures, initial encounter: Secondary | ICD-10-CM | POA: Insufficient documentation

## 2016-01-01 DIAGNOSIS — I10 Essential (primary) hypertension: Secondary | ICD-10-CM | POA: Insufficient documentation

## 2016-01-01 DIAGNOSIS — Y9389 Activity, other specified: Secondary | ICD-10-CM | POA: Insufficient documentation

## 2016-01-01 DIAGNOSIS — Z91010 Allergy to peanuts: Secondary | ICD-10-CM | POA: Insufficient documentation

## 2016-01-01 DIAGNOSIS — Z79899 Other long term (current) drug therapy: Secondary | ICD-10-CM | POA: Insufficient documentation

## 2016-01-01 MED ORDER — ACETAMINOPHEN 325 MG PO TABS
325.0000 mg | ORAL_TABLET | Freq: Once | ORAL | Status: AC
  Administered 2016-01-01: 325 mg via ORAL
  Filled 2016-01-01: qty 1

## 2016-01-01 MED ORDER — IBUPROFEN 200 MG PO TABS
600.0000 mg | ORAL_TABLET | Freq: Once | ORAL | Status: AC
  Administered 2016-01-01: 600 mg via ORAL
  Filled 2016-01-01: qty 3

## 2016-01-01 NOTE — ED Triage Notes (Signed)
Pt slipped off side of driveway last night and then began to have L ankle pain worse with weight bearing.

## 2016-01-01 NOTE — ED Provider Notes (Signed)
[facility] DEPT Provider Note   CSN: [id number] Arrival date & time: 01/01/16  0806     History   Chief Complaint Chief Complaint  Patient presents with  . Ankle Pain    HPI Nancy Silva is a 40 y.o. female.  40 yo AA female with no pmh presents to the ED today with left ankle pain. Patient was carrying groceries into her house yesterday around 2000 and twisted her ankle on side of driveway. Patient has been able to ambulate. She has tried tylenol and ibuprofen with little relief. Swelling has continued which is why she came to today. Pain is worse with weight bearing. Denies any other symptoms.    The history is provided by the patient.  Ankle Pain      Past Medical History:  Diagnosis Date  . Hypertension     Patient Active Problem List   Diagnosis Date Noted  . Atypical angina ([place]) 10/14/2015  . GERD (gastroesophageal reflux disease) 05/07/2014  . Multiple food allergies 11/28/2012  . Anemia 11/14/2012  . Uterine fibroid 11/14/2012  . Obesity, unspecified 11/14/2012  . Healthcare maintenance 11/10/2010  . Essential hypertension 03/01/2006    Past Surgical History:  Procedure Laterality Date  . CESAREAN SECTION    . TUBAL LIGATION  2011    OB History    Gravida Para Term Preterm AB Living   3 2 2  0 1 2   SAB TAB Ectopic Multiple Live Births   1 0 0 0         Home Medications    Prior to Admission medications   Medication Sig Start Date End Date Taking? Authorizing Provider  Multiple Vitamin (MULTIVITAMIN WITH MINERALS) TABS tablet Take 1 tablet by mouth daily.   Yes Historical Provider, MD  sucralfate (CARAFATE) 1 g tablet Take 1 tablet (1 g total) by mouth 4 (four) times daily. 10/14/15 10/13/16 Yes Loleta Chance, MD  EPINEPHrine (EPI-PEN) 0.3 mg/0.3 mL SOAJ injection Inject 0.3 mLs (0.3 mg total) into the muscle once. 06/19/13   Blain Pais, MD  ferrous sulfate 325 (65 FE) MG tablet Take 1 tablet (325 mg total) by mouth daily with  breakfast. Patient not taking: Reported on 01/01/2016 10/02/13   Blain Pais, MD  hydrochlorothiazide (HYDRODIURIL) 12.5 MG tablet Take 1 tablet (12.5 mg total) by mouth daily. Patient not taking: Reported on 01/01/2016 10/14/15   Loleta Chance, MD  ranitidine (ZANTAC) 300 MG tablet Take 1 tablet (300 mg total) by mouth at bedtime. Patient not taking: Reported on 01/01/2016 10/14/15   Loleta Chance, MD    Family History Family History  Problem Relation Age of Onset  . Ovarian cancer Mother     Mother died in her 54s, shorlty the pt was born   . Breast cancer Cousin     diagnosed at age 20    Social History Social History  Substance Use Topics  . Smoking status: Former Smoker    Types: Cigarettes    Quit date: 11/10/2007  . Smokeless tobacco: Never Used  . Alcohol use No     Allergies   Peanuts [nuts] and Shellfish allergy   Review of Systems Review of Systems  Constitutional: Negative for chills and fever.  HENT: Negative for ear pain and sore throat.   Eyes: Negative for pain and visual disturbance.  Respiratory: Negative for cough and shortness of breath.   Cardiovascular: Negative for chest pain and palpitations.  Gastrointestinal: Negative for abdominal pain and vomiting.  Genitourinary: Negative  for dysuria and hematuria.  Musculoskeletal: Positive for joint swelling. Negative for arthralgias and back pain.  Skin: Negative for color change and rash.  Neurological: Negative for dizziness, syncope and headaches.  All other systems reviewed and are negative.    Physical Exam Updated Vital Signs BP 153/97   Pulse 90   Temp 98.3 F (36.8 C) (Oral)   Resp 16   LMP 12/01/2015   SpO2 100%   Physical Exam  Constitutional: She appears well-developed and well-nourished. No distress.  Eyes: Right eye exhibits no discharge. Left eye exhibits no discharge. No scleral icterus.  Pulmonary/Chest: No respiratory distress.  Musculoskeletal: Normal range of motion. She  exhibits no deformity.       Left ankle: She exhibits swelling. She exhibits normal range of motion (Full passive ROM), no ecchymosis and no deformity. Tenderness. Lateral malleolus and medial malleolus tenderness found.  Full ROM. Pain over medial and lateral malleolus. Pain over navicular. Sensation intact. Capillary refill normal. No ecchymosis noted. DP pulses 2+ bilaterally. Patient able to weight bear with pain.  Neurological: She is alert.  Skin: Skin is warm and dry. No pallor.  Nursing note and vitals reviewed.    ED Treatments / Results  Labs (all labs ordered are listed, but only abnormal results are displayed) Labs Reviewed - No data to display  EKG  EKG Interpretation None       Radiology Dg Ankle Complete Left  Result Date: 01/01/2016 CLINICAL DATA:  Injury.  Twisted ankle. EXAM: LEFT ANKLE COMPLETE - 3+ VIEW COMPARISON:  No recent prior. FINDINGS: Soft tissue swelling is noted, particularly prominent over lateral malleolus. Corticated bony density is noted along the posterior aspect of the navicula. This could represent a secondary ossification center or a small old avulsion fracture fragment. IMPRESSION: 1. Prominent soft tissue swelling, particularly over the lateral malleolus . Malleoli are intact. 2. Small corticated bony density noted along the posterior aspect of the navicula. This could represent a secondary ossification center or small old avulsion fracture. Electronically Signed   By: Marcello Moores  Register   On: 01/01/2016 08:55   Dg Foot Complete Left  Result Date: 01/01/2016 CLINICAL DATA:  Stepped off curb, twisted ankle last night. Lateral foot pain. EXAM: LEFT FOOT - COMPLETE 3+ VIEW COMPARISON:  None. FINDINGS: There is no evidence of fracture or dislocation. There is no evidence of arthropathy or other focal bone abnormality. Soft tissues are unremarkable. IMPRESSION: Negative. Electronically Signed   By: Rolm Baptise M.D.   On: 01/01/2016 09:49     Procedures Procedures (including critical care time)  Medications Ordered in ED Medications  ibuprofen (ADVIL,MOTRIN) tablet 600 mg (600 mg Oral Given 01/01/16 0924)  acetaminophen (TYLENOL) tablet 325 mg (325 mg Oral Given 01/01/16 0924)     Initial Impression / Assessment and Plan / ED Course  I have reviewed the triage vital signs and the nursing notes.  Pertinent labs & imaging results that were available during my care of the patient were reviewed by me and considered in my medical decision making (see chart for details).  Clinical Course  Patient presents to ED with left ankle pain after twisting ankle. Xrays shows no signs of fracture or joint space widening. Likely ankle sprain. Neurovascularly intact. Tylenol and ibuprofen given in ED. Patient put in splint and given crutches. RICE encouraged. Follow up with ortho if symptoms fail to improve. Strict return precautions given. Patient verbalized understanding with plan of care. Discharged home in NAD with stable VS. Final  Clinical Impressions(s) / ED Diagnoses   Final diagnoses:  Left ankle sprain, initial encounter    New Prescriptions Discharge Medication List as of 01/01/2016  9:58 AM       Doristine Devoid, PA-C 01/01/16 2151    Dorie Rank, MD 01/02/16 [phone]

## 2016-01-01 NOTE — Discharge Instructions (Signed)
Rest, ice, elevate your ankle. Use crutches as needed. May take tylenol and ibuprofen as needed. Follow up with orthopaedics if symptoms fail to improve. Follow up with primary care doctor as needed. Return to the ED if symptoms worsen.

## 2016-11-06 ENCOUNTER — Encounter: Payer: Self-pay | Admitting: Internal Medicine

## 2016-11-06 ENCOUNTER — Ambulatory Visit (INDEPENDENT_AMBULATORY_CARE_PROVIDER_SITE_OTHER): Payer: Managed Care, Other (non HMO) | Admitting: Internal Medicine

## 2016-11-06 VITALS — BP 162/83 | HR 97 | Temp 98.2°F | Ht 67.0 in | Wt 237.5 lb

## 2016-11-06 DIAGNOSIS — Z87891 Personal history of nicotine dependence: Secondary | ICD-10-CM

## 2016-11-06 DIAGNOSIS — Z1239 Encounter for other screening for malignant neoplasm of breast: Secondary | ICD-10-CM

## 2016-11-06 DIAGNOSIS — Z8249 Family history of ischemic heart disease and other diseases of the circulatory system: Secondary | ICD-10-CM

## 2016-11-06 DIAGNOSIS — Z803 Family history of malignant neoplasm of breast: Secondary | ICD-10-CM

## 2016-11-06 DIAGNOSIS — Z124 Encounter for screening for malignant neoplasm of cervix: Secondary | ICD-10-CM

## 2016-11-06 DIAGNOSIS — Z8041 Family history of malignant neoplasm of ovary: Secondary | ICD-10-CM | POA: Diagnosis not present

## 2016-11-06 DIAGNOSIS — N921 Excessive and frequent menstruation with irregular cycle: Secondary | ICD-10-CM | POA: Diagnosis not present

## 2016-11-06 DIAGNOSIS — I10 Essential (primary) hypertension: Secondary | ICD-10-CM

## 2016-11-06 HISTORY — DX: Excessive and frequent menstruation with irregular cycle: N92.1

## 2016-11-06 MED ORDER — HYDROCHLOROTHIAZIDE 12.5 MG PO TABS: 12.5000 mg | ORAL_TABLET | Freq: Every day | ORAL | 0 refills | Status: DC

## 2016-11-06 MED ORDER — LISINOPRIL-HYDROCHLOROTHIAZIDE 10-12.5 MG PO TABS: 1.0000 | ORAL_TABLET | Freq: Every day | ORAL | 11 refills | Status: DC

## 2016-11-06 NOTE — Assessment & Plan Note (Signed)
She was provided with a referral for mammogram.

## 2016-11-06 NOTE — Progress Notes (Addendum)
   CC: For follow up of hypertension.  HPI:  Nancy Silva is a 41 y.o. with past medical history significant for hypertension, she was started on hydrochlorothiazide last year and given a prescription for 1 month, she stopped taking HCTZ after that and never followed up in the clinic for about a year. According to patient she feels okay and does not feel in need to see a physician. She was very reluctant to start on a antihypertensive.  She had no other complaints. She is having menorrhagia for about a year now, her cycle which used to be very regular now is becoming irregular and she was wondering whether that is just because of her age. Her last period were on 10/22/2016. She denies any exertional dyspnea or chest pain. She was having a small stye in her right eye which was getting better now, she was using over-the-counter medication and warm compresses .  She had a family history of mother died with ovarian cancer at the age of 45 and first cousin died with breast cancer at age of 92 . She also had multiple family members with hypertension . No family history of diabetes .  Past Medical History:  Diagnosis Date  . Hypertension    Review of Systems:  As per HPI.  Physical Exam:  Vitals:   11/06/16 1557  BP: (!) 162/83  Pulse: 97  Temp: 98.2 F (36.8 C)  TempSrc: Oral  SpO2: 100%  Weight: 237 lb 8 oz (107.7 kg)  Height: 5\' 7"  (1.702 m)    General: Vital signs reviewed.  Patient is well-developed and well-nourished, in no acute distress and cooperative with exam.  Head: Normocephalic and atraumatic. Eyes: EOMI, conjunctivae normal, no scleral icterus.  Neck: Supple, trachea midline, normal ROM, no JVD, masses, thyromegaly, or carotid bruit present.  Cardiovascular: RRR, S1 normal, S2 normal, no murmurs, gallops, or rubs. Pulmonary/Chest: Clear to auscultation bilaterally, no wheezes, rales, or rhonchi. Abdominal: Soft, non-tender, non-distended, BS +, no masses,  organomegaly, or guarding present.  Musculoskeletal: No joint deformities, erythema, or stiffness, ROM full and nontender. Extremities: No lower extremity edema bilaterally,  pulses symmetric and intact bilaterally. No cyanosis or clubbing. Neurological: A&O x3, Strength is normal and symmetric bilaterally, cranial nerve II-XII are grossly intact, no focal motor deficit, sensory intact to light touch bilaterally.  Skin: Warm, dry and intact. No rashes or erythema. Psychiatric: Normal mood and affect. speech and behavior is normal. Cognition and memory are normal.  Assessment & Plan:   See Encounters Tab for problem based charting.  Patient discussed with Dr. Angelia Mould.

## 2016-11-06 NOTE — Assessment & Plan Note (Addendum)
She was complaining of heavy periods and stating that her cycle is becoming irregular for the past one year, she had an history of uterine fibroids but not following up with her gynecologist. She was advised to follow with her gynecologist in the past, she lost her insurance and did not see a physician for long time. Now she had her insurance and wants to resume her healthcare. She also has a strong family history of ovarian and breast cancer, her mom died at the age of 3 with ovarian cancer and a first cousin died with breast cancer at the age of 51.  -Check CBC as she had iron deficiency anemia the past. -Give her a new referral for gynecologist. -She also need an Pap smear and ovarian surveillance.

## 2016-11-06 NOTE — Patient Instructions (Addendum)
Thank you for visiting clinic today. As we discuss I'm starting you on a medication for blood pressure, it is a combination pill having to medications called lisinopril and hydrochlorothiazide you are taken these in the past, please follow-up in 4 weeks so we can monitor your blood pressure and adjust your dosage accordingly. I'm also giving you a referral for mammogram and to see a gynecologist. We are doing some basic lab work and will call you with any abnormal results.

## 2016-11-06 NOTE — Assessment & Plan Note (Signed)
BP Readings from Last 3 Encounters:  11/06/16 (!) 162/83  01/01/16 153/97  10/14/15 (!) 155/93   Her blood pressure was elevated. She was not taking any antihypertensives, apparently she was on lisinopril and hydrochlorothiazide in the past stopped taking that and during previous office visit little over a year ago she was restarted on hydrochlorothiazide she took it just for 1 month and never followed up afterwards she was very hesitant to start taking any medications on a regular basis. She had multiple family members with hypertension. She was having protein urea in the past.  -Start her on Zestoretic 10-12.5.-Advised her to follow-up in 4 weeks to titrate her dosage. -Check BMP, microalbumin urea and lipid profile today.

## 2016-11-06 NOTE — Assessment & Plan Note (Signed)
Her previous Pap smear was done in 2012 , which shows normal pathology with high risk HPV positive. She was supposed to get a yearly Pap and a possible colposcopy.  She was offered Pap smear in the clinic, patient stated that she will rather done it at her gynecologist office as we provided her with a referral too.

## 2016-11-07 LAB — MICROALBUMIN / CREATININE URINE RATIO
CREATININE, UR: 201.8 mg/dL
Microalb/Creat Ratio: 82.2 mg/g creat — ABNORMAL HIGH (ref 0.0–30.0)
Microalbumin, Urine: 165.9 ug/mL

## 2016-11-07 LAB — BMP8+ANION GAP
ANION GAP: 17 mmol/L (ref 10.0–18.0)
BUN/Creatinine Ratio: 13 (ref 9–23)
BUN: 10 mg/dL (ref 6–24)
CO2: 23 mmol/L (ref 20–29)
CREATININE: 0.78 mg/dL (ref 0.57–1.00)
Calcium: 9.8 mg/dL (ref 8.7–10.2)
Chloride: 101 mmol/L (ref 96–106)
GFR calc Af Amer: 108 mL/min/{1.73_m2} (ref >59)
GFR calc non Af Amer: 94 mL/min/{1.73_m2} (ref >59)
Glucose: 147 mg/dL — ABNORMAL HIGH (ref 65–99)
Potassium: 4.6 mmol/L (ref 3.5–5.2)
Sodium: 141 mmol/L (ref 134–144)

## 2016-11-07 LAB — CBC
HEMATOCRIT: 30 % — AB (ref 34.0–46.6)
Hemoglobin: 8.7 g/dL — ABNORMAL LOW (ref 11.1–15.9)
MCH: 19.4 pg — ABNORMAL LOW (ref 26.6–33.0)
MCHC: 29 g/dL — ABNORMAL LOW (ref 31.5–35.7)
MCV: 67 fL — AB (ref 79–97)
Platelets: 525 10*3/uL — ABNORMAL HIGH (ref 150–379)
RBC: 4.49 x10E6/uL (ref 3.77–5.28)
RDW: 17.9 % — ABNORMAL HIGH (ref 12.3–15.4)
WBC: 9.3 10*3/uL (ref 3.4–10.8)

## 2016-11-07 LAB — LIPID PANEL
Chol/HDL Ratio: 2.6 ratio (ref 0.0–4.4)
Cholesterol, Total: 135 mg/dL (ref 100–199)
HDL: 52 mg/dL (ref >39)
LDL Calculated: 63 mg/dL (ref 0–99)
Triglycerides: 98 mg/dL (ref 0–149)
VLDL Cholesterol Cal: 20 mg/dL (ref 5–40)

## 2016-11-10 NOTE — Progress Notes (Signed)
Internal Medicine Clinic Attending  Case discussed with Dr. Amin at the time of the visit.  We reviewed the resident's history and exam and pertinent patient test results.  I agree with the assessment, diagnosis, and plan of care documented in the resident's note.    

## 2016-11-17 ENCOUNTER — Ambulatory Visit
Admission: RE | Admit: 2016-11-17 | Discharge: 2016-11-17 | Disposition: A | Discharge status: Home / Self Care | Payer: Managed Care, Other (non HMO) | Source: Ambulatory Visit | Attending: Internal Medicine | Admitting: Internal Medicine

## 2016-11-17 DIAGNOSIS — Z1239 Encounter for other screening for malignant neoplasm of breast: Secondary | ICD-10-CM

## 2016-11-30 ENCOUNTER — Ambulatory Visit (INDEPENDENT_AMBULATORY_CARE_PROVIDER_SITE_OTHER): Payer: Managed Care, Other (non HMO) | Admitting: Internal Medicine

## 2016-11-30 ENCOUNTER — Encounter: Payer: Self-pay | Admitting: Internal Medicine

## 2016-11-30 VITALS — BP 122/69 | HR 79 | Temp 98.3°F | Ht 67.0 in | Wt 231.9 lb

## 2016-11-30 DIAGNOSIS — R7309 Other abnormal glucose: Secondary | ICD-10-CM | POA: Diagnosis not present

## 2016-11-30 DIAGNOSIS — Z87891 Personal history of nicotine dependence: Secondary | ICD-10-CM

## 2016-11-30 DIAGNOSIS — H60502 Unspecified acute noninfective otitis externa, left ear: Secondary | ICD-10-CM

## 2016-11-30 DIAGNOSIS — Z1239 Encounter for other screening for malignant neoplasm of breast: Secondary | ICD-10-CM

## 2016-11-30 DIAGNOSIS — I10 Essential (primary) hypertension: Secondary | ICD-10-CM

## 2016-11-30 DIAGNOSIS — N921 Excessive and frequent menstruation with irregular cycle: Secondary | ICD-10-CM

## 2016-11-30 DIAGNOSIS — D649 Anemia, unspecified: Secondary | ICD-10-CM

## 2016-11-30 DIAGNOSIS — Z79899 Other long term (current) drug therapy: Secondary | ICD-10-CM | POA: Diagnosis not present

## 2016-11-30 DIAGNOSIS — H6090 Unspecified otitis externa, unspecified ear: Secondary | ICD-10-CM | POA: Insufficient documentation

## 2016-11-30 DIAGNOSIS — H9192 Unspecified hearing loss, left ear: Secondary | ICD-10-CM | POA: Insufficient documentation

## 2016-11-30 LAB — GLUCOSE, CAPILLARY: GLUCOSE-CAPILLARY: 90 mg/dL (ref 65–99)

## 2016-11-30 LAB — POCT GLYCOSYLATED HEMOGLOBIN (HGB A1C): HEMOGLOBIN A1C: 6.3

## 2016-11-30 MED ORDER — CIPROFLOXACIN-DEXAMETHASONE 0.3-0.1 % OT SUSP: 4.0000 [drp] | Freq: Two times a day (BID) | OTIC | 0 refills | Status: DC

## 2016-11-30 MED ORDER — FERROUS SULFATE 325 (65 FE) MG PO TABS: 325.0000 mg | ORAL_TABLET | Freq: Every day | ORAL | 6 refills | Status: DC

## 2016-11-30 MED ORDER — NEOMYCIN-POLYMYXIN-HC 3.5-10000-1 OT SOLN: 4.0000 [drp] | Freq: Four times a day (QID) | OTIC | 0 refills | Status: DC

## 2016-11-30 MED ORDER — CIPROFLOXACIN HCL 500 MG PO TABS: 500.0000 mg | ORAL_TABLET | Freq: Two times a day (BID) | ORAL | 0 refills | Status: AC

## 2016-11-30 NOTE — Assessment & Plan Note (Signed)
Her A1c was checked because of mildly elevated blood glucose on previous BMP.  Her A1c today was 6.3. Her previously checked A1c was in 2015 which was 5.4.  Her A1c currently falls in prediabetic range. She was provided with educational material regarding prediabetes and diet management. She was encouraged to exercise regularly and watch for diet.  Repeat A1c in 3 month.

## 2016-11-30 NOTE — Assessment & Plan Note (Signed)
Her CBC done during previous office visit because of her complaints of menorrhagia shows microcytic anemia with hemoglobin of 8.7.  Most likely because of iron deficiency in the setting of menorrhagia. She does have an history of iron deficiency anemia in the past. She is currently not taking any iron supplement.  -Check anemia panel. -Start her on iron supplement.

## 2016-11-30 NOTE — Progress Notes (Signed)
   CC: Hearing loss in left ear since this morning.  HPI:  Nancy Silva is a 41 y.o. with past medical history significant for hypertension came to the clinic with complaint of loss of hearing in her left ear since this morning. According to patient she was having intermittent toothache on left lower molar for about 1-2 weeks denies any swelling or fever,She has a dentist appointment coming up early next week. Yesterday she developed mild earache, and a feeling of clogging of her left ear, when she woke up this morning she felt complete closure of her left ear and inability to hear from her left side. She denied any recent injury, swimming, discharge from her ear, fever or any obvious swelling. Patient is due complaint of mild sinus congestion which she attributes days as seasonal allergies and mild runny nose with clear rhinorrhea. She denies any sore throat, postnasal drip or cough.  Past Medical History:  Diagnosis Date  . Hypertension    Review of Systems:  As per HPI.  Physical Exam:  Vitals:   11/30/16 1409 11/30/16 1413  BP: 122/69 122/69  Pulse: 79 79  Temp: 98.3 F (36.8 C) 98.3 F (36.8 C)  TempSrc: Oral Oral  SpO2: 100%   Weight: 231 lb 14.4 oz (105.2 kg) 231 lb 14.4 oz (105.2 kg)  Height: 5\' 7"  (1.702 m) 5\' 7"  (1.702 m)    General: Vital signs reviewed.  Patient is well-developed and well-nourished, in no acute distress and cooperative with exam.  HEENT.Tenderness and edema of tragus, pulse in the external canal, eardrum normal.one small periauricular lymph node palpable.there was a fractured left lower molar with no obvious sign of infection. There was mild erythema and edema of nasal turbinates bilaterally. There was no pharyngeal erythema, edema or exudate.  Cardiovascular: RRR, S1 normal, S2 normal, no murmurs, gallops, or rubs. Pulmonary/Chest: Clear to auscultation bilaterally, no wheezes, rales, or rhonchi. Abdominal: Soft, non-tender, non-distended, BS +,  no masses, organomegaly, or guarding present.  Extremities: No lower extremity edema bilaterally,  pulses symmetric and intact bilaterally. No cyanosis or clubbing. Skin: Warm, dry and intact. No rashes or erythema. Psychiatric: Normal mood and affect. speech and behavior is normal. Cognition and memory are normal.  Assessment & Plan:   See Encounters Tab for problem based charting.  Patient seen with Dr. Daryll Drown

## 2016-11-30 NOTE — Assessment & Plan Note (Signed)
She continued to get heavy irregular cycle.  She has her appointment with gynecologist on 12/02/2016.  She was advised to keep her appointment, as her menorrhagia is causing anemia and she need a Pap smear and ovarian surveillance.

## 2016-11-30 NOTE — Assessment & Plan Note (Signed)
Her mammogram done in 11/17/2016 was normal.  Repeat mammogram in one year.

## 2016-11-30 NOTE — Assessment & Plan Note (Addendum)
BP Readings from Last 3 Encounters:  11/30/16 122/69  11/06/16 (!) 162/83  01/01/16 153/97   She was normotensive today, she was restarted on Zestoretic during previous office visit. Patient is compliant with that. She has elevated microalbumin/creatinine ratio at 82.2.  -Continue with the current management of Zestoretic 10-12.5 mg daily. -We will follow up with proteinuria in 6 month.

## 2016-11-30 NOTE — Patient Instructions (Addendum)
Thank you for visiting clinic today. I am giving you a prescription for antibiotics and some eardrops. Please use this as directed. Your A1c is in the diabetic range. I'm giving you information regarding that. Watch your diet and exercise regularly and we will monitor your A1c in 3 month. Please follow-up in the clinic next Thursday for reevaluation of your ear.   Preventing Type 2 Diabetes Mellitus Type 2 diabetes (type 2 diabetes mellitus) is a long-term (chronic) disease that affects blood sugar (glucose) levels. Normally, a hormone called insulin allows glucose to enter cells in the body. The cells use glucose for energy. In type 2 diabetes, one or both of these problems may be present:  The body does not make enough insulin.  The body does not respond properly to insulin that it makes (insulin resistance).  Insulin resistance or lack of insulin causes excess glucose to build up in the blood instead of going into cells. As a result, high blood glucose (hyperglycemia) develops, which can cause many complications. Being overweight or obese and having an inactive (sedentary) lifestyle can increase your risk for diabetes. Type 2 diabetes can be delayed or prevented by making certain nutrition and lifestyle changes. What nutrition changes can be made?  Eat healthy meals and snacks regularly. Keep a healthy snack with you for when you get hungry between meals, such as fruit or a handful of nuts.  Eat lean meats and proteins that are low in saturated fats, such as chicken, fish, egg whites, and beans. Avoid processed meats.  Eat plenty of fruits and vegetables and plenty of grains that have not been processed (whole grains). It is recommended that you eat: ? 1?2 cups of fruit every day. ? 2?3 cups of vegetables every day. ? 6?8 oz of whole grains every day, such as oats, whole wheat, bulgur, brown rice, quinoa, and millet.  Eat low-fat dairy products, such as milk, yogurt, and cheese.  Eat  foods that contain healthy fats, such as nuts, avocado, olive oil, and canola oil.  Drink water throughout the day. Avoid drinks that contain added sugar, such as soda or sweet tea.  Follow instructions from your health care provider about specific eating or drinking restrictions.  Control how much food you eat at a time (portion size). ? Check food labels to find out the serving sizes of foods. ? Use a kitchen scale to weigh amounts of foods.  Saute or steam food instead of frying it. Cook with water or broth instead of oils or butter.  Limit your intake of: ? Salt (sodium). Have no more than 1 tsp (2,400 mg) of sodium a day. If you have heart disease or high blood pressure, have less than ? tsp (1,500 mg) of sodium a day. ? Saturated fat. This is fat that is solid at room temperature, such as butter or fat on meat. What lifestyle changes can be made?  Activity  Do moderate-intensity physical activity for at least 30 minutes on at least 5 days of the week, or as much as told by your health care provider.  Ask your health care provider what activities are safe for you. A mix of physical activities may be best, such as walking, swimming, cycling, and strength training.  Try to add physical activity into your day. For example: ? Park in spots that are farther away than usual, so that you walk more. For example, park in a far corner of the parking lot when you go to the office or  the grocery store. ? Take a walk during your lunch break. ? Use stairs instead of elevators or escalators. Weight Loss  Lose weight as directed. Your health care provider can determine how much weight loss is best for you and can help you lose weight safely.  If you are overweight or obese, you may be instructed to lose at least 5?7 % of your body weight. Alcohol and Tobacco   Limit alcohol intake to no more than 1 drink a day for nonpregnant women and 2 drinks a day for men. One drink equals 12 oz of beer,  5 oz of wine, or 1 oz of hard liquor.  Do not use any tobacco products, such as cigarettes, chewing tobacco, and e-cigarettes. If you need help quitting, ask your health care provider. Work With [facility] Provider  Have your blood glucose tested regularly, as told by your health care provider.  Discuss your risk factors and how you can reduce your risk for diabetes.  Get screening tests as told by your health care provider. You may have screening tests regularly, especially if you have certain risk factors for type 2 diabetes.  Make an appointment with a diet and nutrition specialist (registered dietitian). A registered dietitian can help you make a healthy eating plan and can help you understand portion sizes and food labels. Why are these changes important?  It is possible to prevent or delay type 2 diabetes and related health problems by making lifestyle and nutrition changes.  It can be difficult to recognize signs of type 2 diabetes. The best way to avoid possible damage to your body is to take actions to prevent the disease before you develop symptoms. What can happen if changes are not made?  Your blood glucose levels may keep increasing. Having high blood glucose for a long time is dangerous. Too much glucose in your blood can damage your blood vessels, heart, kidneys, nerves, and eyes.  You may develop prediabetes or type 2 diabetes. Type 2 diabetes can lead to many chronic health problems and complications, such as: ? Heart disease. ? Stroke. ? Blindness. ? Kidney disease. ? Depression. ? Poor circulation in the feet and legs, which could lead to surgical removal (amputation) in severe cases. Where to find support:  Ask your health care provider to recommend a registered dietitian, diabetes educator, or weight loss program.  Look for local or online weight loss groups.  Join a gym, fitness club, or outdoor activity group, such as a walking club. Where to find more  information: To learn more about diabetes and diabetes prevention, visit:  American Diabetes Association (ADA): www.diabetes.[employer] of Diabetes and Digestive and Kidney Diseases: FindSpin.nl  To learn more about healthy eating, visit:  The U.S. Department of Agriculture Scientist, research (physical sciences)), Choose My Plate: http://wiley-williams.com/  Office of Disease Prevention and Health Promotion (ODPHP), Dietary Guidelines: SurferLive.at  Summary  You can reduce your risk for type 2 diabetes by increasing your physical activity, eating healthy foods, and losing weight as directed.  Talk with your health care provider about your risk for type 2 diabetes. Ask about any blood tests or screening tests that you need to have. This information is not intended to replace advice given to you by your health care provider. Make sure you discuss any questions you have with your health care provider. Document Released: 08/05/2015 Document Revised: 09/19/2015 Document Reviewed: 06/04/2015 Elsevier Interactive Patient Education  2018 [employer]. Prediabetes Eating Plan Prediabetes-also called impaired glucose tolerance  or impaired fasting glucose-is a condition that causes blood sugar (blood glucose) levels to be higher than normal. Following a healthy diet can help to keep prediabetes under control. It can also help to lower the risk of type 2 diabetes and heart disease, which are increased in people who have prediabetes. Along with regular exercise, a healthy diet:  Promotes weight loss.  Helps to control blood sugar levels.  Helps to improve the way that the body uses insulin.  What do I need to know about this eating plan?  Use the glycemic index (GI) to plan your meals. The index tells you how quickly a food will raise your blood sugar. Choose low-GI foods. These foods take a longer time to raise blood sugar.  Pay close attention to the  amount of carbohydrates in the food that you eat. Carbohydrates increase blood sugar levels.  Keep track of how many calories you take in. Eating the right amount of calories will help you to achieve a healthy weight. Losing about 7 percent of your starting weight can help to prevent type 2 diabetes.  You may want to follow a Mediterranean diet. This diet includes a lot of vegetables, lean meats or fish, whole grains, fruits, and healthy oils and fats. What foods can I eat? Grains Whole grains, such as whole-wheat or whole-grain breads, crackers, cereals, and pasta. Unsweetened oatmeal. Bulgur. Barley. Quinoa. Brown rice. Corn or whole-wheat flour tortillas or taco shells. Vegetables Lettuce. Spinach. Peas. Beets. Cauliflower. Cabbage. Broccoli. Carrots. Tomatoes. Squash. Eggplant. Herbs. Peppers. Onions. Cucumbers. Brussels sprouts. Fruits Berries. Bananas. Apples. Oranges. Grapes. Papaya. Mango. Pomegranate. Kiwi. Grapefruit. Cherries. Meats and Other Protein Sources Seafood. Lean meats, such as chicken and [place] or lean cuts of pork and beef. Tofu. Eggs. Nuts. Beans. Dairy Low-fat or fat-free dairy products, such as yogurt, cottage cheese, and cheese. Beverages Water. Tea. Coffee. Sugar-free or diet soda. Seltzer water. Milk. Milk alternatives, such as soy or almond milk. Condiments Mustard. Relish. Low-fat, low-sugar ketchup. Low-fat, low-sugar barbecue sauce. Low-fat or fat-free mayonnaise. Sweets and Desserts Sugar-free or low-fat pudding. Sugar-free or low-fat ice cream and other frozen treats. Fats and Oils Avocado. Walnuts. Olive oil. The items listed above may not be a complete list of recommended foods or beverages. Contact your dietitian for more options. What foods are not recommended? Grains Refined white flour and flour products, such as bread, pasta, snack foods, and cereals. Beverages Sweetened drinks, such as sweet iced tea and soda. Sweets and Desserts Baked goods,  such as cake, cupcakes, pastries, cookies, and cheesecake. The items listed above may not be a complete list of foods and beverages to avoid. Contact your dietitian for more information. This information is not intended to replace advice given to you by your health care provider. Make sure you discuss any questions you have with your health care provider. Document Released: 08/28/2014 Document Revised: 09/19/2015 Document Reviewed: 05/09/2014 Elsevier Interactive Patient Education  2017 Elsevier Inc.  Prediabetes Prediabetes is the condition of having a blood sugar (blood glucose) level that is higher than it should be, but not high enough for you to be diagnosed with type 2 diabetes. Having prediabetes puts you at risk for developing type 2 diabetes (type 2 diabetes mellitus). Prediabetes may be called impaired glucose tolerance or impaired fasting glucose. Prediabetes usually does not cause symptoms. Your health care provider can diagnose this condition with blood tests. You may be tested for prediabetes if you are overweight and if you have at least one other  risk factor for prediabetes. Risk factors for prediabetes include:  Having a family member with type 2 diabetes.  Being overweight or obese.  Being older than age 51.  Being of American-Indian, African-American, Hispanic/Latino, or Asian/Pacific Islander descent.  Having an inactive (sedentary) lifestyle.  Having a history of gestational diabetes or polycystic ovarian syndrome (PCOS).  Having low levels of good cholesterol (HDL-C) or high levels of blood fats (triglycerides).  Having high blood pressure.  What is blood glucose and how is blood glucose measured?  Blood glucose refers to the amount of glucose in your bloodstream. Glucose comes from eating foods that contain sugars and starches (carbohydrates) that the body breaks down into glucose. Your blood glucose level may be measured in mg/dL (milligrams per deciliter) or  mmol/L (millimoles per liter).Your blood glucose may be checked with one or more of the following blood tests:  A fasting blood glucose (FBG) test. You will not be allowed to eat (you will fast) for at least 8 hours before a blood sample is taken. ? A normal range for FBG is 70-100 mg/dl (3.9-5.6 mmol/L).  An A1c (hemoglobin A1c) blood test. This test provides information about blood glucose control over the previous 2?54months.  An oral glucose tolerance test (OGTT). This test measures your blood glucose twice: ? After fasting. This is your baseline level. ? Two hours after you drink a beverage that contains glucose.  You may be diagnosed with prediabetes:  If your FBG is 100?125 mg/dL (5.6-6.9 mmol/L).  If your A1c level is 5.7?6.4%.  If your OGGT result is 140?199 mg/dL (7.8-11 mmol/L).  These blood tests may be repeated to confirm your diagnosis. What happens if blood glucose is too high? The pancreas produces a hormone (insulin) that helps move glucose from the bloodstream into cells. When cells in the body do not respond properly to insulin that the body makes (insulin resistance), excess glucose builds up in the blood instead of going into cells. As a result, high blood glucose (hyperglycemia) can develop, which can cause many complications. This is a symptom of prediabetes. What can happen if blood glucose stays higher than normal for a long time? Having high blood glucose for a long time is dangerous. Too much glucose in your blood can damage your nerves and blood vessels. Long-term damage can lead to complications from diabetes, which may include:  Heart disease.  Stroke.  Blindness.  Kidney disease.  Depression.  Poor circulation in the feet and legs, which could lead to surgical removal (amputation) in severe cases.  How can prediabetes be prevented from turning into type 2 diabetes?  To help prevent type 2 diabetes, take the following actions:  Be physically  active. ? Do moderate-intensity physical activity for at least 30 minutes on at least 5 days of the week, or as much as told by your health care provider. This could be brisk walking, biking, or water aerobics. ? Ask your health care provider what activities are safe for you. A mix of physical activities may be best, such as walking, swimming, cycling, and strength training.  Lose weight as told by your health care provider. ? Losing 5-7% of your body weight can reverse insulin resistance. ? Your health care provider can determine how much weight loss is best for you and can help you lose weight safely.  Follow a healthy meal plan. This includes eating lean proteins, complex carbohydrates, fresh fruits and vegetables, low-fat dairy products, and healthy fats. ? Follow instructions from your health  care provider about eating or drinking restrictions. ? Make an appointment to see a diet and nutrition specialist (registered dietitian) to help you create a healthy eating plan that is right for you.  Do not smoke or use any tobacco products, such as cigarettes, chewing tobacco, and e-cigarettes. If you need help quitting, ask your health care provider.  Take over-the-counter and prescription medicines as told by your health care provider. You may be prescribed medicines that help lower the risk of type 2 diabetes.  This information is not intended to replace advice given to you by your health care provider. Make sure you discuss any questions you have with your health care provider. Document Released: 08/05/2015 Document Revised: 09/19/2015 Document Reviewed: 06/04/2015 Elsevier Interactive Patient Education  [employer].

## 2016-11-30 NOTE — Assessment & Plan Note (Addendum)
Her symptoms and exam are more consistent with otitis externa. No history of recent swimming. Might be a rupturing pustule in external canal, causing purulent discharge and clogging of left ear. Pus was removed with Q-tip, which improved her hearing. There was marked edema of external canal.  -Ciprofloxacin 500 mg twice a day for 1 week. -Ciprodex to be used 4 times a day and left ear, later talked with the patient and find out that Ciprodex is costing her couple of $100  and she is unable to afford it, talked with pharmacy and prescription was changed to Cortisporin which is $10 according to her prescription plan. -Follow-up on Thursday for resolution of symptoms, if she continued to have symptoms or worsening of pain, or she develop any systemic signs of infection, she will need a CT head and IV antibiotics.

## 2016-12-01 LAB — ANEMIA PROFILE B
BASOS: 1 %
Basophils Absolute: 0.1 10*3/uL (ref 0.0–0.2)
EOS (ABSOLUTE): 0.3 10*3/uL (ref 0.0–0.4)
EOS: 4 %
FERRITIN: 11 ng/mL — AB (ref 15–150)
FOLATE: 13.1 ng/mL (ref >3.0)
HEMATOCRIT: 31.1 % — AB (ref 34.0–46.6)
HEMOGLOBIN: 9.1 g/dL — AB (ref 11.1–15.9)
IRON SATURATION: 5 % — AB (ref 15–55)
Immature Grans (Abs): 0 10*3/uL (ref 0.0–0.1)
Immature Granulocytes: 0 %
Iron: 16 ug/dL — ABNORMAL LOW (ref 27–159)
LYMPHS ABS: 2.5 10*3/uL (ref 0.7–3.1)
Lymphs: 34 %
MCH: 19.9 pg — AB (ref 26.6–33.0)
MCHC: 29.3 g/dL — AB (ref 31.5–35.7)
MCV: 68 fL — ABNORMAL LOW (ref 79–97)
Monocytes Absolute: 0.5 10*3/uL (ref 0.1–0.9)
Monocytes: 7 %
NEUTROS ABS: 4 10*3/uL (ref 1.4–7.0)
Neutrophils: 54 %
Platelets: 563 10*3/uL — ABNORMAL HIGH (ref 150–379)
RBC: 4.58 x10E6/uL (ref 3.77–5.28)
RDW: 18.4 % — ABNORMAL HIGH (ref 12.3–15.4)
Retic Ct Pct: 1.1 % (ref 0.6–2.6)
Total Iron Binding Capacity: 350 ug/dL (ref 250–450)
UIBC: 334 ug/dL (ref 131–425)
Vitamin B-12: 629 pg/mL (ref 232–1245)
WBC: 7.4 10*3/uL (ref 3.4–10.8)

## 2016-12-03 ENCOUNTER — Ambulatory Visit (INDEPENDENT_AMBULATORY_CARE_PROVIDER_SITE_OTHER): Payer: Managed Care, Other (non HMO) | Admitting: Internal Medicine

## 2016-12-03 ENCOUNTER — Encounter: Payer: Self-pay | Admitting: Internal Medicine

## 2016-12-03 DIAGNOSIS — Z5189 Encounter for other specified aftercare: Secondary | ICD-10-CM | POA: Diagnosis not present

## 2016-12-03 DIAGNOSIS — Z79899 Other long term (current) drug therapy: Secondary | ICD-10-CM

## 2016-12-03 DIAGNOSIS — Z87891 Personal history of nicotine dependence: Secondary | ICD-10-CM

## 2016-12-03 DIAGNOSIS — H60502 Unspecified acute noninfective otitis externa, left ear: Secondary | ICD-10-CM

## 2016-12-03 DIAGNOSIS — I10 Essential (primary) hypertension: Secondary | ICD-10-CM | POA: Diagnosis not present

## 2016-12-03 NOTE — Assessment & Plan Note (Signed)
Improving.  She is on ciprofloxacin, never picked up Cortisporin.  She was advised to complete the course of ciprofloxacin. She does not need eardrops at this time.

## 2016-12-03 NOTE — Patient Instructions (Signed)
Thank You for visiting clinic today. I'm glad that your are doing very well. Please complete your antibiotic course as directed. Please start taking your iron supplement at least twice daily. Please follow-up in 3 month, we will recheck your blood at that time.

## 2016-12-03 NOTE — Progress Notes (Addendum)
Internal Medicine Clinic Attending  I saw and evaluated the patient.  I personally confirmed the key portions of the history and exam documented by Dr. Reesa Chew and I reviewed pertinent patient test results.  The assessment, diagnosis, and plan were formulated together and I agree with the documentation in the resident's note.  On exam, patient with posterior swelling at the tragus which almost completely obstructed the external ear canal.  What I could see on otoscope exam showed an intact and normal appearing ear drum.  She had some pus draining from swollen area.  Tragus was very tender and painful on manipulation.

## 2016-12-03 NOTE — Progress Notes (Signed)
   CC: For follow-up of her otitis externa.  HPI:  [name] is a 41 y.o. with past medical history significant for hypertension, came to the clinic today for follow-up of her recent external ear infection. Patient was seen in clinic on Monday, 11/30/2016 with a complaint of sudden hearing loss along with pain and swelling of left ear, found to have edema of tragus and external auditory canal along with pustular drainage. She was given a course of ciprofloxacin for 7 days and Cortisporin eardrops. Patient just using ciprofloxacin, never picked up eardrops because of the cost, stating that she was planning to pick it up tomorrow after getting a paycheck today.  Patient was feeling much better today. Her hearing loss has been resolved. Her swelling and pain has been improved. She continued to get some pustular drainage. Her tooth ache has been improved with antibiotic too. She has a dental appointment next week. She denies any fever or any other symptoms.  Past Medical History:  Diagnosis Date  . Hypertension    Review of Systems:  As per HPI.  Physical Exam:  Vitals:   12/03/16 0857  BP: 128/66  Pulse: 74  Temp: 98.2 F (36.8 C)  SpO2: 99%  Weight: 230 lb 4.8 oz (104.5 kg)  Height: 5\' 7"  (1.702 m)   Vitals:   12/03/16 0857  BP: 128/66  Pulse: 74  Temp: 98.2 F (36.8 C)  SpO2: 99%  Weight: 230 lb 4.8 oz (104.5 kg)  Height: 5\' 7"  (1.702 m)   General: Vital signs reviewed.  Patient is well-developed and well-nourished, in no acute distress and cooperative with exam.  HEENT.She is having a small papule on left tragus with a opening, no drainage noted. No edema. External auditory canal and tympanic membrane appears normal. Cardiovascular: RRR, S1 normal, S2 normal, no murmurs, gallops, or rubs. Pulmonary/Chest: Clear to auscultation bilaterally, no wheezes, rales, or rhonchi. Abdominal: Soft, non-tender, non-distended, BS +, no masses, organomegaly, or guarding present.   Extremities: No lower extremity edema bilaterally,  pulses symmetric and intact bilaterally. No cyanosis or clubbing.   Assessment & Plan:   See Encounters Tab for problem based charting.  Patient seen with Dr. Daryll Drown.

## 2016-12-03 NOTE — Assessment & Plan Note (Signed)
BP Readings from Last 3 Encounters:  12/03/16 128/66  11/30/16 122/69  11/06/16 (!) 162/83   She was normotensive today.  Continue current management with Zestoretic.

## 2016-12-04 NOTE — Progress Notes (Signed)
Internal Medicine Clinic Attending  I saw and evaluated the patient.  I personally confirmed the key portions of the history and exam documented by Dr. Reesa Chew and I reviewed pertinent patient test results.  The assessment, diagnosis, and plan were formulated together and I agree with the documentation in the resident's note.  Ear canal now open and hearing back to normal.

## 2017-01-14 ENCOUNTER — Ambulatory Visit (INDEPENDENT_AMBULATORY_CARE_PROVIDER_SITE_OTHER): Payer: 59 | Admitting: Internal Medicine

## 2017-01-14 VITALS — BP 139/86 | HR 76 | Temp 98.1°F | Ht 67.0 in | Wt 230.1 lb

## 2017-01-14 DIAGNOSIS — S50362A Insect bite (nonvenomous) of left elbow, initial encounter: Secondary | ICD-10-CM

## 2017-01-14 DIAGNOSIS — Z79899 Other long term (current) drug therapy: Secondary | ICD-10-CM

## 2017-01-14 DIAGNOSIS — R2232 Localized swelling, mass and lump, left upper limb: Secondary | ICD-10-CM | POA: Diagnosis not present

## 2017-01-14 DIAGNOSIS — Z8041 Family history of malignant neoplasm of ovary: Secondary | ICD-10-CM

## 2017-01-14 DIAGNOSIS — Z803 Family history of malignant neoplasm of breast: Secondary | ICD-10-CM | POA: Diagnosis not present

## 2017-01-14 DIAGNOSIS — I10 Essential (primary) hypertension: Secondary | ICD-10-CM | POA: Diagnosis not present

## 2017-01-14 DIAGNOSIS — Z87891 Personal history of nicotine dependence: Secondary | ICD-10-CM

## 2017-01-14 DIAGNOSIS — M25522 Pain in left elbow: Secondary | ICD-10-CM

## 2017-01-14 DIAGNOSIS — W57XXXA Bitten or stung by nonvenomous insect and other nonvenomous arthropods, initial encounter: Secondary | ICD-10-CM

## 2017-01-14 MED ORDER — CEPHALEXIN 500 MG PO CAPS: 500.0000 mg | ORAL_CAPSULE | Freq: Two times a day (BID) | ORAL | 0 refills | Status: AC

## 2017-01-14 NOTE — Progress Notes (Signed)
   CC: painful bump on left elbow  HPI:  [name] is a 41 y.o. comes in with a bump that developed on Sunday, she thinks it may be from a spider bite.  She has noticed swelling and erythema and pain in the are and it doesn't seem to be healing. She feels like it has gone down some in size.  She has tried alcohol wipes on it so far but nothing else. It is worse with movement better with keeping arm straight. We also wanted to discuss how her blood pressure has been doing.    Please see A&P for status of the patient's chronic medical conditions  Past Medical History:  Diagnosis Date  . Hypertension    Review of Systems:  ROS: Pulmonary: pt denies increased work of breathing, shortness of breath,  Cardiac: pt denies palpitations, chest pain,  Abdominal: pt denies abdominal pain, nausea, vomiting, or diarrhea  Physical Exam:  Vitals:   01/14/17 0945  BP: 139/86  Pulse: 76  Temp: 98.1 F (36.7 C)  TempSrc: Oral  SpO2: 100%  Weight: 230 lb 1.6 oz (104.4 kg)  Height: 5\' 7"  (1.702 m)   Physical Exam  Constitutional: She appears well-developed and well-nourished.  Cardiovascular: Normal rate, regular rhythm and normal heart sounds.   No murmur heard. Pulmonary/Chest: Effort normal and breath sounds normal. No respiratory distress. She has no wheezes. She has no rales.  Abdominal: Soft. Bowel sounds are normal. She exhibits no distension. There is no tenderness.  Edema on the left elbow pictured below, no visible discharge or erythema, was painful to palpation and painful with passive and active motion.  Media Information     Document Information   Photos    01/14/2017 10:18  Attached To:  [name], MD  [facility]     Social History   Social History  . Marital status: Single    Spouse name: N/A  . Number of children: N/A  . Years of education: N/A   Occupational History  . Not on file.    Social History Main Topics  . Smoking status: Former Smoker    Types: Cigarettes    Quit date: 11/10/2007  . Smokeless tobacco: Never Used  . Alcohol use No  . Drug use: No  . Sexual activity: Yes    Birth control/ protection: None   Other Topics Concern  . Not on file   Social History Narrative  . No narrative on file    Family History  Problem Relation Age of Onset  . Ovarian cancer Mother        Mother died in her 82s, shorlty the pt was born   . Breast cancer Cousin        diagnosed at age 49    Assessment & Plan:  Sore on left elbow/frompossible insect bite: patient noticed a bump on Sunday some erythema and swelling on the left elbow.  She admits it has gone down some in size since then but is still painful.  She has not visualized any drainage or pus.  She denies fever or chillsor any systemic symptoms that would indicate infection and she was afebrile today With normal vitals.   -prescribed Keflex 500 mg twice a day for 5 days -Educated the patient on using warm compresses to aid in the healing process.   See Encounters Tab for problem based charting.  Patient seen with Dr. Beryle Beams

## 2017-01-14 NOTE — Patient Instructions (Addendum)
Please take the keflex as prescribed and apply warm compresses until the sore resolves.  We have a picture documented, please let us know if you develop a fever or chills or begin to feel ill, you may need a stronger antibiotic and we may have you go to the emergency room.  However, we do not anticipate this and this will most likely resolve in the next week or so.

## 2017-01-14 NOTE — Assessment & Plan Note (Signed)
patient's blood pressure is 139/86 today. She reports good compliance with her lisinopril hydrochlorothiazide.  Her last 2 readings averaged 130/70.    -continue current regimen listed above -We will continue to monitor for now

## 2017-01-15 ENCOUNTER — Telehealth: Payer: Self-pay

## 2017-01-15 NOTE — Telephone Encounter (Signed)
Needs to speak with a nurse about left arm is still swollen. Please call pt back.

## 2017-01-15 NOTE — Progress Notes (Signed)
Medicine attending: I personally interviewed and briefly examined this patient on the day of the patient visit and reviewed pertinent clinical ,laboratory, and radiographic data  with resident physician Dr. Vickki Muff and we discussed a management plan. Small mildly tender area, non erythematous, left elbow. Non specific. We will give a short course of doxycycline. Pt advised to use warm compresses.

## 2017-01-15 NOTE — Telephone Encounter (Signed)
rtc to pt, no answer, lm for rtc 

## 2017-01-25 NOTE — Telephone Encounter (Signed)
F/U Call - no answer; left message to call if she still having any problems .

## 2017-03-12 ENCOUNTER — Encounter: Payer: Managed Care, Other (non HMO) | Admitting: Internal Medicine

## 2017-04-30 ENCOUNTER — Ambulatory Visit: Payer: 59 | Admitting: Internal Medicine

## 2017-04-30 ENCOUNTER — Encounter: Payer: Self-pay | Admitting: Internal Medicine

## 2017-04-30 ENCOUNTER — Other Ambulatory Visit: Payer: Self-pay

## 2017-04-30 VITALS — BP 136/68 | HR 101 | Temp 98.4°F | Ht 67.0 in | Wt 230.1 lb

## 2017-04-30 DIAGNOSIS — Z79899 Other long term (current) drug therapy: Secondary | ICD-10-CM | POA: Diagnosis not present

## 2017-04-30 DIAGNOSIS — N920 Excessive and frequent menstruation with regular cycle: Secondary | ICD-10-CM

## 2017-04-30 DIAGNOSIS — Z87891 Personal history of nicotine dependence: Secondary | ICD-10-CM

## 2017-04-30 DIAGNOSIS — I10 Essential (primary) hypertension: Secondary | ICD-10-CM | POA: Diagnosis not present

## 2017-04-30 DIAGNOSIS — N921 Excessive and frequent menstruation with irregular cycle: Secondary | ICD-10-CM

## 2017-04-30 DIAGNOSIS — D500 Iron deficiency anemia secondary to blood loss (chronic): Secondary | ICD-10-CM | POA: Diagnosis not present

## 2017-04-30 MED ORDER — POLYSACCHARIDE IRON COMPLEX 150 MG PO CAPS: 150.0000 mg | ORAL_CAPSULE | Freq: Every day | ORAL | 2 refills | Status: DC

## 2017-04-30 NOTE — Assessment & Plan Note (Signed)
She was evaluated by Harle Battiest, had a normal pap smear and they recommended hormonal therapy which she currently declines.  Discussed return precautions for worsening menorrhagia and symptomatic anemia.

## 2017-04-30 NOTE — Patient Instructions (Signed)
FOLLOW-UP INSTRUCTIONS When: 3 months For: anemia What to bring: blood pressure log from home  We will check your electrolytes since being on the blood pressure medicine, as well as to see how your iron level has responded so far to supplementation.

## 2017-04-30 NOTE — Progress Notes (Signed)
   CC: hypertension  HPI:  [name] is a 42 y.o. with a PMH of HTN, iron deficiency anemia 2/2 menorrhagia presenting to clinic for follow up on HTN and iron def anemia.  HTN: Patient states compliance with lisinopril-HCTZ 10-12.5mg  daily; she states she intermittently checks her BP at home and it is generally around 140/70s. She denies chest pain, shortness of breath, headache, vision or hearing changes.   Iron deficiency anemia: Patient with anemia 2/2 menorrhagia. Patient takes iron supplement about 3 times per week due to causing stomach upset and constipation. She denies fatigue, weakness, dizziness, shortness of breath. She was evaluated by Harle Battiest, had a normal pap smear and they recommended hormonal therapy which she currently declines. She has never had to have blood transfusions in the past.   Please see problem based Assessment and Plan for status of patients chronic conditions.  Past Medical History:  Diagnosis Date  . Hypertension     Review of Systems:   ROS Per HPI  Physical Exam:  Vitals:   04/30/17 1346  BP: (!) 150/80  Pulse: (!) 101  Temp: 98.4 F (36.9 C)  TempSrc: Oral  SpO2: 99%  Weight: 230 lb 1.6 oz (104.4 kg)  Height: 5\' 7"  (1.702 m)   GENERAL- alert, co-operative, appears as stated age, not in any distress. HEENT- oral mucosa appears moist CARDIAC- RRR, no murmurs, rubs or gallops. RESP- Moving equal volumes of air, and clear to auscultation bilaterally, no wheezes or crackles. ABDOMEN- Soft, nontender, bowel sounds present. NEURO- No obvious Cr N abnormality. EXTREMITIES- pulse 2+, symmetric, no pedal edema. SKIN- Warm, dry, no rash or lesion. PSYCH- Normal mood and affect, appropriate thought content and speech.  Assessment & Plan:   See Encounters Tab for problem based charting.   Patient discussed with Dr. Gerrit Friends, MD Internal Medicine PGY2

## 2017-04-30 NOTE — Assessment & Plan Note (Signed)
Patient with anemia 2/2 menorrhagia. Patient takes iron supplement about 3 times per week due to causing stomach upset and constipation. She denies fatigue, weakness, dizziness, shortness of breath. She was evaluated by Harle Battiest, had a normal pap smear and they recommended hormonal therapy which she currently declines. She has never had to have blood transfusions in the past.   Plan: --f/u ferritin --prescribed iron-polysaccharides as better side effect profile; advised patient to take daily --discussed signs of worsening anemia and advised to rtc if symptomatic or worsening menorrhagia --f/u with PCP in 3 months

## 2017-04-30 NOTE — Assessment & Plan Note (Signed)
Patient states compliance with lisinopril-HCTZ 10-12.5mg  daily; she states she intermittently checks her BP at home and it is generally around 140/70s. She denies chest pain, shortness of breath, headache, vision or hearing changes.   Repeat BP wnl.   Plan: --continue lisinopril-HCTZ 10-12.5mg  daily --f/u Bmet  --if renal function and electrolytes wnl, will have pt f/u in 3 months with PCP for BP check --advised to keep BP log from home and bring in to future visits

## 2017-05-01 LAB — BMP8+ANION GAP
Anion Gap: 16 mmol/L (ref 10.0–18.0)
BUN/Creatinine Ratio: 12 (ref 9–23)
BUN: 9 mg/dL (ref 6–24)
CO2: 22 mmol/L (ref 20–29)
CREATININE: 0.73 mg/dL (ref 0.57–1.00)
Calcium: 9.2 mg/dL (ref 8.7–10.2)
Chloride: 103 mmol/L (ref 96–106)
GFR calc Af Amer: 117 mL/min/{1.73_m2} (ref >59)
GFR, EST NON AFRICAN AMERICAN: 102 mL/min/{1.73_m2} (ref >59)
Glucose: 72 mg/dL (ref 65–99)
Potassium: 4.1 mmol/L (ref 3.5–5.2)
SODIUM: 141 mmol/L (ref 134–144)

## 2017-05-01 LAB — FERRITIN: Ferritin: 9 ng/mL — ABNORMAL LOW (ref 15–150)

## 2017-05-03 NOTE — Progress Notes (Signed)
Internal Medicine Clinic Attending  Case discussed with Dr. Svalina  at the time of the visit.  We reviewed the resident's history and exam and pertinent patient test results.  I agree with the assessment, diagnosis, and plan of care documented in the resident's note.  

## 2017-07-30 ENCOUNTER — Encounter: Payer: 59 | Admitting: Internal Medicine

## 2017-08-20 ENCOUNTER — Encounter: Payer: 59 | Admitting: Internal Medicine

## 2017-08-27 ENCOUNTER — Other Ambulatory Visit: Payer: Self-pay

## 2017-08-27 ENCOUNTER — Ambulatory Visit: Payer: 59 | Admitting: Internal Medicine

## 2017-08-27 ENCOUNTER — Encounter: Payer: Self-pay | Admitting: Internal Medicine

## 2017-08-27 VITALS — BP 144/73 | HR 82 | Temp 98.7°F | Ht 67.0 in | Wt 228.8 lb

## 2017-08-27 DIAGNOSIS — Z79899 Other long term (current) drug therapy: Secondary | ICD-10-CM | POA: Diagnosis not present

## 2017-08-27 DIAGNOSIS — D509 Iron deficiency anemia, unspecified: Secondary | ICD-10-CM | POA: Diagnosis not present

## 2017-08-27 DIAGNOSIS — D500 Iron deficiency anemia secondary to blood loss (chronic): Secondary | ICD-10-CM

## 2017-08-27 DIAGNOSIS — N946 Dysmenorrhea, unspecified: Secondary | ICD-10-CM

## 2017-08-27 DIAGNOSIS — I10 Essential (primary) hypertension: Secondary | ICD-10-CM | POA: Diagnosis not present

## 2017-08-27 DIAGNOSIS — R252 Cramp and spasm: Secondary | ICD-10-CM | POA: Diagnosis not present

## 2017-08-27 DIAGNOSIS — N921 Excessive and frequent menstruation with irregular cycle: Secondary | ICD-10-CM

## 2017-08-27 NOTE — Assessment & Plan Note (Signed)
She is experiencing worsening menorrhagia and dysmenorrhea currently. In the past she was offered hormonal therapy by her gynecologist which she declined, not she is ready to consider because of her worsening symptoms.  She will see her gynecologist next week.

## 2017-08-27 NOTE — Patient Instructions (Addendum)
Thank you for visiting clinic today. As we discussed we will check your blood level today. Please continue taking your iron supplement. Your leg cramps can be due to low iron. Please call your gynecologist as you continue to experience worsening heavy bleeding. You can try to release twice daily for your pain with your cycle. Continue taking your blood pressure medicine as directed, please to take it on the day when you come visit [place] so we can better evaluate you. Check your blood pressure at home and make a log-bring that log with you during next follow-up appointment. Follow-up in 29-month.   DASH Eating Plan DASH stands for "Dietary Approaches to Stop Hypertension." The DASH eating plan is a healthy eating plan that has been shown to reduce high blood pressure (hypertension). It may also reduce your risk for type 2 diabetes, heart disease, and stroke. The DASH eating plan may also help with weight loss. What are tips for following this plan? General guidelines  Avoid eating more than 2,300 mg (milligrams) of salt (sodium) a day. If you have hypertension, you may need to reduce your sodium intake to 1,500 mg a day.  Limit alcohol intake to no more than 1 drink a day for nonpregnant women and 2 drinks a day for men. One drink equals 12 oz of beer, 5 oz of wine, or 1 oz of hard liquor.  Work with your health care provider to maintain a healthy body weight or to lose weight. Ask what an ideal weight is for you.  Get at least 30 minutes of exercise that causes your heart to beat faster (aerobic exercise) most days of the week. Activities may include walking, swimming, or biking.  Work with your health care provider or diet and nutrition specialist (dietitian) to adjust your eating plan to your individual calorie needs. Reading food labels  Check food labels for the amount of sodium per serving. Choose foods with less than 5 percent of the Daily Value of sodium. Generally, foods with less  than 300 mg of sodium per serving fit into this eating plan.  To find whole grains, look for the word "whole" as the first word in the ingredient list. Shopping  Buy products labeled as "low-sodium" or "no salt added."  Buy fresh foods. Avoid canned foods and premade or frozen meals. Cooking  Avoid adding salt when cooking. Use salt-free seasonings or herbs instead of table salt or sea salt. Check with your health care provider or pharmacist before using salt substitutes.  Do not fry foods. Cook foods using healthy methods such as baking, boiling, grilling, and broiling instead.  Cook with heart-healthy oils, such as olive, canola, soybean, or sunflower oil. Meal planning   Eat a balanced diet that includes: ? 5 or more servings of fruits and vegetables each day. At each meal, try to fill half of your plate with fruits and vegetables. ? Up to 6-8 servings of whole grains each day. ? Less than 6 oz of lean meat, poultry, or fish each day. A 3-oz serving of meat is about the same size as a deck of cards. One egg equals 1 oz. ? 2 servings of low-fat dairy each day. ? A serving of nuts, seeds, or beans 5 times each week. ? Heart-healthy fats. Healthy fats called Omega-3 fatty acids are found in foods such as flaxseeds and coldwater fish, like sardines, salmon, and mackerel.  Limit how much you eat of the following: ? Canned or prepackaged foods. ? Food that  is high in trans fat, such as fried foods. ? Food that is high in saturated fat, such as fatty meat. ? Sweets, desserts, sugary drinks, and other foods with added sugar. ? Full-fat dairy products.  Do not salt foods before eating.  Try to eat at least 2 vegetarian meals each week.  Eat more home-cooked food and less restaurant, buffet, and fast food.  When eating at a restaurant, ask that your food be prepared with less salt or no salt, if possible. What foods are recommended? The items listed may not be a complete list. Talk  with your dietitian about what dietary choices are best for you. Grains Whole-grain or whole-wheat bread. Whole-grain or whole-wheat pasta. Brown rice. [place]row. Bulgur. Whole-grain and low-sodium cereals. Pita bread. Low-fat, low-sodium crackers. Whole-wheat flour tortillas. Vegetables Fresh or frozen vegetables (raw, steamed, roasted, or grilled). Low-sodium or reduced-sodium tomato and vegetable juice. Low-sodium or reduced-sodium tomato sauce and tomato paste. Low-sodium or reduced-sodium canned vegetables. Fruits All fresh, dried, or frozen fruit. Canned fruit in natural juice (without added sugar). Meat and other protein foods Skinless chicken or [place]. Ground chicken or [place]. Pork with fat trimmed off. Fish and seafood. Egg whites. Dried beans, peas, or lentils. Unsalted nuts, nut butters, and seeds. Unsalted canned beans. Lean cuts of beef with fat trimmed off. Low-sodium, lean deli meat. Dairy Low-fat (1%) or fat-free (skim) milk. Fat-free, low-fat, or reduced-fat cheeses. Nonfat, low-sodium ricotta or cottage cheese. Low-fat or nonfat yogurt. Low-fat, low-sodium cheese. Fats and oils Soft margarine without trans fats. Vegetable oil. Low-fat, reduced-fat, or light mayonnaise and salad dressings (reduced-sodium). Canola, safflower, olive, soybean, and sunflower oils. Avocado. Seasoning and other foods Herbs. Spices. Seasoning mixes without salt. Unsalted popcorn and pretzels. Fat-free sweets. What foods are not recommended? The items listed may not be a complete list. Talk with your dietitian about what dietary choices are best for you. Grains Baked goods made with fat, such as croissants, muffins, or some breads. Dry pasta or rice meal packs. Vegetables Creamed or fried vegetables. Vegetables in a cheese sauce. Regular canned vegetables (not low-sodium or reduced-sodium). Regular canned tomato sauce and paste (not low-sodium or reduced-sodium). Regular tomato and vegetable  juice (not low-sodium or reduced-sodium). Angie Fava. Olives. Fruits Canned fruit in a light or heavy syrup. Fried fruit. Fruit in cream or butter sauce. Meat and other protein foods Fatty cuts of meat. Ribs. Fried meat. Berniece Salines. Sausage. Bologna and other processed lunch meats. Salami. Fatback. Hotdogs. Bratwurst. Salted nuts and seeds. Canned beans with added salt. Canned or smoked fish. Whole eggs or egg yolks. Chicken or [place] with skin. Dairy Whole or 2% milk, cream, and half-and-half. Whole or full-fat cream cheese. Whole-fat or sweetened yogurt. Full-fat cheese. Nondairy creamers. Whipped toppings. Processed cheese and cheese spreads. Fats and oils Butter. Stick margarine. Lard. Shortening. Ghee. Bacon fat. Tropical oils, such as coconut, palm kernel, or palm oil. Seasoning and other foods Salted popcorn and pretzels. Onion salt, garlic salt, seasoned salt, table salt, and sea salt. Worcestershire sauce. Tartar sauce. Barbecue sauce. Teriyaki sauce. Soy sauce, including reduced-sodium. Steak sauce. Canned and packaged gravies. Fish sauce. Oyster sauce. Cocktail sauce. Horseradish that you find on the shelf. Ketchup. Mustard. Meat flavorings and tenderizers. Bouillon cubes. Hot sauce and Tabasco sauce. Premade or packaged marinades. Premade or packaged taco seasonings. Relishes. Regular salad dressings. Where to find more information:  National Heart, Lung, and [place]: https://wilson-eaton.com/  American Heart Association: www.heart.org Summary  The DASH eating plan is a healthy  eating plan that has been shown to reduce high blood pressure (hypertension). It may also reduce your risk for type 2 diabetes, heart disease, and stroke.  With the DASH eating plan, you should limit salt (sodium) intake to 2,300 mg a day. If you have hypertension, you may need to reduce your sodium intake to 1,500 mg a day.  When on the DASH eating plan, aim to eat more fresh fruits and vegetables, whole grains,  lean proteins, low-fat dairy, and heart-healthy fats.  Work with your health care provider or diet and nutrition specialist (dietitian) to adjust your eating plan to your individual calorie needs. This information is not intended to replace advice given to you by your health care provider. Make sure you discuss any questions you have with your health care provider. Document Released: 04/02/2011 Document Revised: 04/06/2016 Document Reviewed: 04/06/2016 Elsevier Interactive Patient Education  [employer].

## 2017-08-27 NOTE — Assessment & Plan Note (Addendum)
Her ferritin level checked in January was 9. According to her she is taking her iron supplement daily. Continue to experience menorrhagia. She was complaining of intermittent foot and leg cramps which can be due to iron deficiency. She denies any exertional dyspnea.  -We will recheck CBC and ferritin today.

## 2017-08-27 NOTE — Progress Notes (Signed)
   CC: For follow-up of her hypertension and iron deficiency anemia.  HPI:  Nancy Silva is a 42 y.o. with past medical history as listed below came to the clinic to follow-up of her hypertension and iron deficiency anemia.  She started her menstrual cycle yesterday, currently very heavy with passage of clots and dysmenorrhea.  According to patient she continued to experience worsening menorrhagia and dysmenorrhea.  She was going to see her gynecologist next week.  Please see assessment and plan for her chronic conditions.  Past Medical History:  Diagnosis Date  . Hypertension   . Iron deficiency anemia 11/14/2012  . Menorrhagia with irregular cycle 11/06/2016   Review of Systems: Active except mentioned in HPI.  Physical Exam:  Vitals:   08/27/17 1422  BP: (!) 144/73  Pulse: 82  Temp: 98.7 F (37.1 C)  TempSrc: Oral  SpO2: 98%  Weight: 228 lb 12.8 oz (103.8 kg)  Height: 5\' 7"  (1.702 m)    General: Vital signs reviewed.  Patient is well-developed and well-nourished, in no acute distress and cooperative with exam.  Head: Normocephalic and atraumatic. Eyes: EOMI, conjunctivae normal, no scleral icterus.  Cardiovascular: RRR, S1 normal, S2 normal, no murmurs, gallops, or rubs. Pulmonary/Chest: Clear to auscultation bilaterally, no wheezes, rales, or rhonchi. Abdominal: Soft, non-tender, non-distended, BS +, no masses, organomegaly, or guarding present.  Extremities: No lower extremity edema bilaterally,  pulses symmetric and intact bilaterally. No cyanosis or clubbing.  Skin: Warm, dry and intact. No rashes or erythema. Psychiatric: Normal mood and affect. speech and behavior is normal. Cognition and memory are normal.  Assessment & Plan:   See Encounters Tab for problem based charting.  Patient discussed with Dr. Rebeca Alert.

## 2017-08-27 NOTE — Assessment & Plan Note (Signed)
BP Readings from Last 3 Encounters:  08/27/17 (!) 144/73  04/30/17 136/68  01/14/17 139/86   Blood pressure was little elevated today. She never took her morning meds because she was  having a bad dysmenorrhea.  We will continue with the current management of Zestoretic 10-12.5 mg daily and will evaluate during next follow-up visit.

## 2017-08-28 LAB — CBC
Hematocrit: 34.9 % (ref 34.0–46.6)
Hemoglobin: 10.6 g/dL — ABNORMAL LOW (ref 11.1–15.9)
MCH: 22.8 pg — ABNORMAL LOW (ref 26.6–33.0)
MCHC: 30.4 g/dL — ABNORMAL LOW (ref 31.5–35.7)
MCV: 75 fL — ABNORMAL LOW (ref 79–97)
Platelets: 418 10*3/uL — ABNORMAL HIGH (ref 150–379)
RBC: 4.64 x10E6/uL (ref 3.77–5.28)
RDW: 16.8 % — ABNORMAL HIGH (ref 12.3–15.4)
WBC: 8.9 10*3/uL (ref 3.4–10.8)

## 2017-08-28 LAB — FERRITIN: Ferritin: 17 ng/mL (ref 15–150)

## 2017-08-30 NOTE — Progress Notes (Signed)
Internal Medicine Clinic Attending  Case discussed with Dr. Reesa Chew  at the time of the visit.  We reviewed the resident's history and exam and pertinent patient test results.  I agree with the assessment, diagnosis, and plan of care documented in the resident's note.  Worsening menorrhagia and dysmenorrhea, has upcoming gynecology appointment. Fortunately, maintaining H/H on current iron therapy, but still warrants gynecology evaluation and treatment, likely with OCPs.   Oda Kilts, MD

## 2017-10-19 ENCOUNTER — Ambulatory Visit (HOSPITAL_COMMUNITY)
Admission: EM | Admit: 2017-10-19 | Discharge: 2017-10-19 | Disposition: A | Discharge status: Home / Self Care | Payer: 59 | Attending: Family Medicine | Admitting: Family Medicine

## 2017-10-19 ENCOUNTER — Encounter (HOSPITAL_COMMUNITY): Payer: Self-pay | Admitting: Emergency Medicine

## 2017-10-19 ENCOUNTER — Other Ambulatory Visit: Payer: Self-pay

## 2017-10-19 DIAGNOSIS — R42 Dizziness and giddiness: Secondary | ICD-10-CM | POA: Diagnosis not present

## 2017-10-19 LAB — POCT I-STAT, CHEM 8
BUN: 11 mg/dL (ref 6–20)
CALCIUM ION: 1.19 mmol/L (ref 1.15–1.40)
CHLORIDE: 97 mmol/L — AB (ref 98–111)
CREATININE: 0.8 mg/dL (ref 0.44–1.00)
GLUCOSE: 109 mg/dL — AB (ref 70–99)
HCT: 38 % (ref 36.0–46.0)
Hemoglobin: 12.9 g/dL (ref 12.0–15.0)
Potassium: 3.8 mmol/L (ref 3.5–5.1)
Sodium: 137 mmol/L (ref 135–145)
TCO2: 26 mmol/L (ref 22–32)

## 2017-10-19 NOTE — ED Triage Notes (Signed)
The patient presented to the [facility] with a complaint of a sudden onset of dizziness and nausea while at work.

## 2017-10-19 NOTE — Discharge Instructions (Signed)
Rest. Drink plenty of fluids. Home with rest the day. Eat well-balanced diet. Call your PCP if not better by tomorrow.

## 2017-10-19 NOTE — ED Provider Notes (Signed)
[name]    CSN: [id number] Arrival date & time: 10/19/17  1345     History   Chief Complaint Chief Complaint  Patient presents with  . Dizziness    HPI Nancy Silva is a 42 y.o. female.   HPI  Patient states that she got up at her normal time.  He had a normal breakfast.  [employer] to work.  Around 9:00 started feeling vaguely lightheaded.  No spinning sensation.  Felt like her head was "foggy".  Trouble focusing.  Nausea.  Waited until lunchtime, try to do her work.  Did not feel better so came in for evaluation.  She called her primary care doctor who advised her to come to the urgent care center. She is a history of hemoglobin A 1C of 6.3 a couple years back.  She does not have any known diabetes.  Not medicated for this.  Has not had high or low sugars.  Did not have diaphoresis or signs of hypoglycemia. She does have hypertension.  Is well managed with medication.  Her blood pressure has been normal.  Blood pressure and orthostatics are normal at this visit. She does have a history of menorrhagia causing iron deficiency anemia.  She is on iron.  Her hemoglobin has not been checked recently.  She thinks that since she just finished heavy.  She could be low on iron. She is never had dizzy spells before her vertigo.  No ear pressure or pain.  No hearing loss.  No sinus infection.  No ear symptoms.  No head injury. Chest pain or problems.  No palpitations.  No cardiovascular history.  No numbness or weakness in arms and legs.  No cerebrovascular history.  Past Medical History:  Diagnosis Date  . Hypertension   . Iron deficiency anemia 11/14/2012  . Menorrhagia with irregular cycle 11/06/2016    Patient Active Problem List   Diagnosis Date Noted  . Other abnormal glucose 11/30/2016  . Menorrhagia with irregular cycle 11/06/2016  . Screening for cervical cancer 11/06/2016  . Multiple food allergies 11/28/2012  . Iron deficiency anemia 11/14/2012  . Uterine fibroid  11/14/2012  . Obesity, unspecified 11/14/2012  . Screening for breast cancer 11/10/2010  . Essential hypertension 03/01/2006    Past Surgical History:  Procedure Laterality Date  . CESAREAN SECTION    . TUBAL LIGATION  2011    OB History    Gravida  3   Para  2   Term  2   Preterm  0   AB  1   Living  2     SAB  1   TAB  0   Ectopic  0   Multiple  0   Live Births               Home Medications    Prior to Admission medications   Medication Sig Start Date End Date Taking? Authorizing Provider  EPINEPHrine (EPI-PEN) 0.3 mg/0.3 mL SOAJ injection Inject 0.3 mLs (0.3 mg total) into the muscle once. 06/19/13   Blain Pais, MD  iron polysaccharides (NU-IRON) 150 MG capsule Take 1 capsule (150 mg total) by mouth daily. 04/30/17   Alphonzo Grieve, MD  lisinopril-hydrochlorothiazide (ZESTORETIC) 10-12.5 MG tablet Take 1 tablet by mouth daily. 11/06/16 11/06/17  Lorella Nimrod, MD  Multiple Vitamin (MULTIVITAMIN WITH MINERALS) TABS tablet Take 1 tablet by mouth daily.    [provider]    Family History Family History  Problem Relation Age of Onset  .  Ovarian cancer Mother        Mother died in her 50s, shorlty the pt was born   . Breast cancer Cousin        diagnosed at age 19    Social History Social History   Tobacco Use  . Smoking status: Former Smoker    Types: Cigarettes    Last attempt to quit: 11/10/2007    Years since quitting: 9.9  . Smokeless tobacco: Never Used  Substance Use Topics  . Alcohol use: No    Alcohol/week: 0.0 oz  . Drug use: No     Allergies   Peanuts [nuts] and Shellfish allergy   Review of Systems Review of Systems   Physical Exam Triage Vital Signs ED Triage Vitals  Enc Vitals Group     BP 10/19/17 1427 (!) 142/78     Pulse Rate 10/19/17 1427 78     Resp 10/19/17 1427 18     Temp 10/19/17 1427 99.7 F (37.6 C)     Temp Source 10/19/17 1427 Oral     SpO2 10/19/17 1427 100 %     Weight --       Height --      Head Circumference --      Peak Flow --      Pain Score 10/19/17 1426 0     Pain Loc --      Pain Edu? --      Excl. in [place]? --    Orthostatic VS for the past 24 hrs:  BP- Lying Pulse- Lying BP- Sitting Pulse- Sitting BP- Standing at 0 minutes Pulse- Standing at 0 minutes  10/19/17 1507 130/73 83 134/80 79 (!) 137/94 96    Updated Vital Signs BP (!) 142/78 (BP Location: Left Arm)   Pulse 78   Temp 99.7 F (37.6 C) (Oral)   Resp 18   SpO2 100%       Physical Exam  Constitutional: She appears well-developed and well-nourished. No distress.  HENT:  Head: Normocephalic and atraumatic.  Right Ear: External ear normal.  Left Ear: External ear normal.  Nose: Nose normal.  Mouth/Throat: Oropharynx is clear and moist.  Eyes: Pupils are equal, round, and reactive to light. Conjunctivae and EOM are normal.  Fundi benign.  Discs flat.  No nystagmus  Neck: Normal range of motion. No thyromegaly present.  Cardiovascular: Normal rate, regular rhythm, normal heart sounds and intact distal pulses.  Pulmonary/Chest: Effort normal and breath sounds normal. No respiratory distress. She has no wheezes. She has no rales.  Abdominal: Soft. Bowel sounds are normal. She exhibits no distension. There is no tenderness.  Musculoskeletal: Normal range of motion. She exhibits no edema.  Lymphadenopathy:    She has no cervical adenopathy.  Neurological: She is alert. She displays normal reflexes. No cranial nerve deficit. She exhibits normal muscle tone. Coordination normal.  Skin: Skin is warm and dry. She is not diaphoretic.  Psychiatric: Her behavior is normal.     UC Treatments / Results  Labs (all labs ordered are listed, but only abnormal results are displayed) Labs Reviewed  POCT I-STAT, CHEM 8 - Abnormal; Notable for the following components:      Result Value   Chloride 97 (*)    Glucose, Bld 109 (*)    All other components within normal limits     EKG None  Radiology No results found.  Procedures Procedures (including critical care time)  Medications Ordered in UC Medications - No data to  display  Initial Impression / Assessment and Plan / UC Course  I have reviewed the triage vital signs and the nursing notes.  Pertinent labs & imaging results that were available during my care of the patient were reviewed by me and considered in my medical decision making (see chart for details).     Explained to the patient that her physical exam is normal.  Her laboratory tests are normal.  Her blood pressure and orthostatic test are normal.  I do not have a diagnosis for her lightheadedness.  I do feel she does not have a serious condition at this time.  Certainly she could be coming down with something such as a virus.  She is going to go home and rest call your family doctor if your symptoms persist.  Return promptly for any worsening of condition. Final Clinical Impressions(s) / UC Diagnoses   Final diagnoses:  Lightheadedness     Discharge Instructions     Rest. Drink plenty of fluids. Home with rest the day. Eat well-balanced diet. Call your PCP if not better by tomorrow.    ED Prescriptions    None     Controlled Substance Prescriptions [place] Controlled Substance Registry consulted? Not Applicable   Raylene Everts, MD 10/19/17 2101

## 2017-12-01 ENCOUNTER — Other Ambulatory Visit: Payer: Self-pay | Admitting: Internal Medicine

## 2017-12-20 ENCOUNTER — Encounter: Payer: Self-pay | Admitting: Internal Medicine

## 2017-12-20 ENCOUNTER — Encounter: Payer: 59 | Admitting: Internal Medicine

## 2018-04-03 ENCOUNTER — Other Ambulatory Visit: Payer: Self-pay | Admitting: Internal Medicine

## 2018-04-04 NOTE — Telephone Encounter (Signed)
Next appt scheduled 12/16 with PCP. 

## 2018-04-11 ENCOUNTER — Encounter: Payer: 59 | Admitting: Internal Medicine

## 2018-07-27 ENCOUNTER — Telehealth: Payer: Self-pay

## 2018-07-27 NOTE — Telephone Encounter (Signed)
Requesting to speak with a nurse about cough. Please call back.  

## 2018-07-27 NOTE — Telephone Encounter (Signed)
The choice is up to you. You can call the pt yourself as a regular call or if you want a telehealth call, we can schedule with the pt at a certain time . Just let us know.

## 2018-07-27 NOTE — Telephone Encounter (Signed)
Called pt - stated she has a dry cough x 3 weeks; tried OTC's meds without any success. No fever nor shortness of breath. Has been having some sinus drainage and sl wheezing. Agreeable to telehealth call if appropriate. Uses [facility]. Thanks

## 2018-07-27 NOTE — Telephone Encounter (Signed)
Telehealth call will be more effective.

## 2018-07-27 NOTE — Telephone Encounter (Signed)
Telehealth call scheduled 4/2 @ 1115 AM in [facility].

## 2018-07-27 NOTE — Telephone Encounter (Signed)
We can do a telehealth call, do you want me to give her a call or it will be done by [facility]?

## 2018-07-27 NOTE — Telephone Encounter (Signed)
Dr Reesa Chew, do want telehealth call done in [facility] or schedule one with you? Thanks

## 2018-07-28 ENCOUNTER — Other Ambulatory Visit: Payer: Self-pay

## 2018-07-28 ENCOUNTER — Ambulatory Visit (INDEPENDENT_AMBULATORY_CARE_PROVIDER_SITE_OTHER): Payer: 59 | Admitting: Internal Medicine

## 2018-07-28 DIAGNOSIS — R053 Chronic cough: Secondary | ICD-10-CM

## 2018-07-28 DIAGNOSIS — R05 Cough: Secondary | ICD-10-CM | POA: Diagnosis not present

## 2018-07-28 DIAGNOSIS — J302 Other seasonal allergic rhinitis: Secondary | ICD-10-CM | POA: Diagnosis not present

## 2018-07-28 DIAGNOSIS — I10 Essential (primary) hypertension: Secondary | ICD-10-CM

## 2018-07-28 MED ORDER — LISINOPRIL-HYDROCHLOROTHIAZIDE 10-12.5 MG PO TABS: 1.0000 | ORAL_TABLET | Freq: Every day | ORAL | 2 refills | Status: DC

## 2018-07-28 MED ORDER — FLUTICASONE PROPIONATE 50 MCG/ACT NA SUSP: 2.0000 | Freq: Every day | NASAL | 0 refills | Status: DC

## 2018-07-28 MED ORDER — BENZONATATE 100 MG PO CAPS: 200.0000 mg | ORAL_CAPSULE | Freq: Three times a day (TID) | ORAL | 0 refills | Status: DC | PRN

## 2018-07-28 NOTE — Assessment & Plan Note (Signed)
Recommended she start flonase daily.

## 2018-07-28 NOTE — Assessment & Plan Note (Signed)
Patient reported that she has been having seasonal allergies for about the past 3 weeks.  She initially had sinus drainage and a nonproductive cough.  She states that her sinus drainage is mostly at night now however her dry cough has worsened and is all throughout the day now.  She denies fever, shortness of breath, chest pain, sinus pain/pressure or headaches.  She endorsed some wheezing.  She states that for work she is constantly on the phone or talking with people in person so she feels that that is not helping her cough.  She has tried over-the-counter medication such as Claritin, Zyrtec, Benadryl and Sudafed.  She states she had some relief with these medications however not with her dry cough.   Plan- -Tessalon perles and flonase - If her symptoms persist we can try a cough suppressant such as guafenesin-codeine

## 2018-07-28 NOTE — Telephone Encounter (Signed)
Thanks

## 2018-07-28 NOTE — Progress Notes (Signed)
   [facility] Telephone Encounter  Reason for call:   This telephone encounter was created for Ms. Mahima Hottle on 07/28/2018 for the following purpose/cc persistent dry cough.    Assessment / Plan / Recommendations:   Persistent dry cough- patient reported that she has been having seasonal allergies for about the past 3 weeks.  She initially had sinus drainage and a nonproductive cough.  She states that her sinus drainage is mostly at night now however her dry cough has worsened and is all throughout the day now.  She denies fever, shortness of breath, chest pain, sinus pain/pressure or headaches.  She endorsed some wheezing.  She states that for work she is constantly on the phone or talking with people in person so she feels that that is not helping her cough.  She has tried over-the-counter medication such as Claritin, Zyrtec, Benadryl and Sudafed.  She states she had some relief with these medications however not with her dry cough.    Plan-discussed trying Tessalon Perles and Flonase at this time.  If her symptoms persist we can try a cough suppressant such as guafenesin-codeine. Also recommended to continue taking benadryl at night for continued drainage.   As always, pt is advised that if symptoms worsen or new symptoms arise, they should go to an urgent care facility or to to ER for further evaluation.   Consent and Medical Decision Making:   Patient discussed with Dr. Lynnae January  This is a telephone encounter between [name] and [place] on 07/28/2018 for persistent dry cough for 3 weeks. The visit was conducted with the patient located at home and Jaques Mineer N Faris Coolman at [facility]. The patient's identity was confirmed using their DOB and current address. The patient has consented to being evaluated through a telephone encounter and understands the associated risks (an examination cannot be done and the patient may need to come in for an appointment) /  benefits (allows the patient to remain at home, decreasing exposure to coronavirus). I personally spent 8 minutes on medical discussion.       Past Medical History:  Diagnosis Date  . Hypertension   . Iron deficiency anemia 11/14/2012  . Menorrhagia with irregular cycle 11/06/2016   Review of Systems:   Review of Systems  Constitutional: Negative for chills, diaphoresis, fever and malaise/fatigue.  HENT: Negative for congestion, ear discharge, ear pain and sore throat.   Eyes: Negative for pain and discharge.  Respiratory: Positive for cough and wheezing. Negative for sputum production and shortness of breath.   Cardiovascular: Negative for chest pain.    Physical Exam: Unable to obtain due to this being a telephone encounter  There were no vitals filed for this visit.

## 2018-07-29 NOTE — Progress Notes (Signed)
Internal Medicine Clinic Attending  Case discussed with Dr. Rehman at the time of the visit.  We reviewed the resident's history and exam and pertinent patient test results.  I agree with the assessment, diagnosis, and plan of care documented in the resident's note.  

## 2018-08-19 ENCOUNTER — Other Ambulatory Visit: Payer: Self-pay | Admitting: Internal Medicine

## 2018-08-19 DIAGNOSIS — J302 Other seasonal allergic rhinitis: Secondary | ICD-10-CM

## 2018-09-20 ENCOUNTER — Other Ambulatory Visit: Payer: Self-pay | Admitting: Oncology

## 2018-09-20 DIAGNOSIS — J302 Other seasonal allergic rhinitis: Secondary | ICD-10-CM

## 2018-10-24 ENCOUNTER — Other Ambulatory Visit: Payer: Self-pay | Admitting: Internal Medicine

## 2018-10-24 ENCOUNTER — Encounter: Payer: Self-pay | Admitting: *Deleted

## 2018-10-24 DIAGNOSIS — J302 Other seasonal allergic rhinitis: Secondary | ICD-10-CM

## 2018-11-03 ENCOUNTER — Telehealth: Payer: Self-pay | Admitting: *Deleted

## 2018-11-03 DIAGNOSIS — Z20828 Contact with and (suspected) exposure to other viral communicable diseases: Secondary | ICD-10-CM | POA: Insufficient documentation

## 2018-11-03 DIAGNOSIS — Z20822 Contact with and (suspected) exposure to covid-19: Secondary | ICD-10-CM | POA: Insufficient documentation

## 2018-11-03 NOTE — Addendum Note (Signed)
Addended by: Benson Setting L on: 11/03/2018 05:05 PM   Modules accepted: Orders

## 2018-11-03 NOTE — Telephone Encounter (Signed)
NURSING TRIAGE NOTE FOR RESPIRATORY SYMPTOMS  Pt states coworker tested +POSITIVE+ for COVID test came back 7/8, her workplace does use masks  Do you have a fever?"no"  Do you have a cough?" dry cough, no production, not coughing a lot"  Do you have shortness of breath more than normal? "no"  Do you have chest pain?"no"  Are you able to eat and drink normally?"yes"   Have you seen a physician for these symptoms? "no"  Pt's son has been scheduled for 7/10 at 63 at [facility], pt would like to go at same time, when she scheduled son, the staff told her she would need an order from pcp  Action  She understands shelter in place, masking, cleanliness of hands, not touching face and keeping surfaces clean  I informed patient to expect a phone call from a physician soon and I sent request to front desk to schedule a virtual office appt for patient and arrive the patient.

## 2018-11-03 NOTE — Telephone Encounter (Signed)
Informed pt, she will call for appt

## 2018-11-03 NOTE — Telephone Encounter (Signed)
Spoke with pt and she has an appointment scheduled at [facility] @ 2:45. Pt is aware to remain in her car as well as wear a mask. Pt is aware results could take up to 3-7 days to return and her mychart is active. Pt understood and had no additional questions at this time. Nothing further is needed. Re-ordered the test as test ordered was not future.

## 2018-11-03 NOTE — Telephone Encounter (Signed)
Patient is calling to set up an appt for covid testing Call back [phone]

## 2018-11-04 ENCOUNTER — Other Ambulatory Visit: Payer: 59

## 2018-11-04 DIAGNOSIS — Z20822 Contact with and (suspected) exposure to covid-19: Secondary | ICD-10-CM

## 2018-11-04 DIAGNOSIS — Z20828 Contact with and (suspected) exposure to other viral communicable diseases: Secondary | ICD-10-CM

## 2018-11-08 ENCOUNTER — Other Ambulatory Visit: Payer: Self-pay | Admitting: Internal Medicine

## 2018-11-08 DIAGNOSIS — J302 Other seasonal allergic rhinitis: Secondary | ICD-10-CM

## 2018-11-10 LAB — NOVEL CORONAVIRUS, NAA: SARS-CoV-2, NAA: NOT DETECTED

## 2019-04-03 ENCOUNTER — Encounter: Payer: Self-pay | Admitting: Internal Medicine

## 2019-04-03 ENCOUNTER — Ambulatory Visit (INDEPENDENT_AMBULATORY_CARE_PROVIDER_SITE_OTHER): Payer: 59 | Admitting: Internal Medicine

## 2019-04-03 ENCOUNTER — Other Ambulatory Visit: Payer: Self-pay

## 2019-04-03 VITALS — BP 148/77 | HR 89 | Temp 98.3°F | Wt 254.0 lb

## 2019-04-03 DIAGNOSIS — R05 Cough: Secondary | ICD-10-CM

## 2019-04-03 DIAGNOSIS — Z9181 History of falling: Secondary | ICD-10-CM

## 2019-04-03 DIAGNOSIS — G8911 Acute pain due to trauma: Secondary | ICD-10-CM

## 2019-04-03 DIAGNOSIS — W19XXXA Unspecified fall, initial encounter: Secondary | ICD-10-CM | POA: Insufficient documentation

## 2019-04-03 DIAGNOSIS — Z23 Encounter for immunization: Secondary | ICD-10-CM

## 2019-04-03 DIAGNOSIS — I10 Essential (primary) hypertension: Secondary | ICD-10-CM

## 2019-04-03 DIAGNOSIS — R053 Chronic cough: Secondary | ICD-10-CM

## 2019-04-03 DIAGNOSIS — M25521 Pain in right elbow: Secondary | ICD-10-CM

## 2019-04-03 DIAGNOSIS — Z79899 Other long term (current) drug therapy: Secondary | ICD-10-CM

## 2019-04-03 DIAGNOSIS — D500 Iron deficiency anemia secondary to blood loss (chronic): Secondary | ICD-10-CM

## 2019-04-03 NOTE — Assessment & Plan Note (Signed)
Patient presents for evaluation of cough has been gone approximately three weeks. Sometimes it is dry. Sometimes it is productive. She uses Flonase and has noticed some relief with PRN use of antihistamines. She denies GERD symptoms. She does not smoke. Physical exam is unremarkable.  A/P: - Continue Flonase and start daily antihistamine  - Hold off on chronic cough work-up

## 2019-04-03 NOTE — Assessment & Plan Note (Signed)
Patient presents for continued evaluation and management of her hypertension. She is currently on combination lisinopril hydrochlorothiazide. She is taking this medication daily with her breakfast. She did not take it this morning. She denies adverse effects do the medication. She denies orthostatic symptoms.   A/P: - Continue Lisinopril-HCTZ 10-12.5 QD - Check BMP

## 2019-04-03 NOTE — Progress Notes (Signed)
   CC: HTN, Fall, Cough, Iron deficiency anemia   HPI:  [name] is a 43 y.o. female with PMHx listed below presenting for HTN, Fall, Cough, Iron deficiency anemia . Please see the A&P for the status of the patient's chronic medical problems.  Past Medical History:  Diagnosis Date  . Hypertension   . Iron deficiency anemia 11/14/2012  . Menorrhagia with irregular cycle 11/06/2016   Review of Systems:  Performed and all others negative.  Physical Exam: Vitals:   04/03/19 0901  BP: (!) 148/77  Pulse: 89  Temp: 98.3 F (36.8 C)  TempSrc: Oral  SpO2: 99%  Weight: 254 lb (115.2 kg)   General: Obese female in no acute distress Pulm: Good air movement with no wheezing or crackles  CV: RRR, no murmurs, no rubs  Extremities: Pulses palpable in all extremities, no LE edema   Assessment & Plan:   See Encounters Tab for problem based charting.  Patient discussed with Dr. Heber [place]

## 2019-04-03 NOTE — Patient Instructions (Addendum)
Thank you for allowing [place] to provide your care. Today we discussed the following:   1) For your hand numbness we are going to try night time wrist splints. You can get them at a local drug store. If are y your symptoms get worse please call us back.   2) For your cough I want you to continue to take the Flonase in and out on my daily antihistamines such as Benadryl, Zyrtec, or Claritin. Please call us if this does not improve.   Continue to take all your medications as prescribed. Today we are checking some blood work and I will call you if we need to make any changes. I would like you to come back to see [place] in 6 months or sooner if any issues arise.

## 2019-04-03 NOTE — Assessment & Plan Note (Signed)
Patient fell in October. She struck her right elbow. Since that time she has had intermittent numbness and tingling that radiates down to the Palmer surface of her hand. She notes worsening at night. She states that the pain has improved. She does occasionally feel weak. Physical exam there is no atrophy for weakness.  A/P: - Her symptoms are inconsistent with ulnar or median nerve entrapment. Discussed that her symptoms would have to involve the brachial plexus but at this time her exam is reassuring. Will hold off on further evaluation

## 2019-04-03 NOTE — Assessment & Plan Note (Signed)
Patient with a history of iron deficiency anemia. No recent labs. She is taking 150 mg of iron QD. She is experiencing some constipation.   A/P: - Check CBC and iron studies - Change iron to every other day and take with vitamin C

## 2019-04-04 ENCOUNTER — Telehealth: Payer: Self-pay | Admitting: Internal Medicine

## 2019-04-04 DIAGNOSIS — D500 Iron deficiency anemia secondary to blood loss (chronic): Secondary | ICD-10-CM

## 2019-04-04 LAB — BMP8+ANION GAP
Anion Gap: 12 mmol/L (ref 10.0–18.0)
BUN/Creatinine Ratio: 10 (ref 9–23)
BUN: 6 mg/dL (ref 6–24)
CO2: 24 mmol/L (ref 20–29)
Calcium: 9 mg/dL (ref 8.7–10.2)
Chloride: 103 mmol/L (ref 96–106)
Creatinine, Ser: 0.62 mg/dL (ref 0.57–1.00)
GFR calc Af Amer: 127 mL/min/{1.73_m2} (ref >59)
GFR calc non Af Amer: 110 mL/min/{1.73_m2} (ref >59)
Glucose: 102 mg/dL — ABNORMAL HIGH (ref 65–99)
Potassium: 4.3 mmol/L (ref 3.5–5.2)
Sodium: 139 mmol/L (ref 134–144)

## 2019-04-04 LAB — CBC WITH DIFFERENTIAL/PLATELET
Basophils Absolute: 0.1 10*3/uL (ref 0.0–0.2)
Basos: 1 %
EOS (ABSOLUTE): 0.4 10*3/uL (ref 0.0–0.4)
Eos: 5 %
Hematocrit: 30.8 % — ABNORMAL LOW (ref 34.0–46.6)
Hemoglobin: 9 g/dL — ABNORMAL LOW (ref 11.1–15.9)
Immature Grans (Abs): 0 10*3/uL (ref 0.0–0.1)
Immature Granulocytes: 0 %
Lymphocytes Absolute: 2 10*3/uL (ref 0.7–3.1)
Lymphs: 27 %
MCH: 20.6 pg — ABNORMAL LOW (ref 26.6–33.0)
MCHC: 29.2 g/dL — ABNORMAL LOW (ref 31.5–35.7)
MCV: 71 fL — ABNORMAL LOW (ref 79–97)
Monocytes Absolute: 0.5 10*3/uL (ref 0.1–0.9)
Monocytes: 7 %
Neutrophils Absolute: 4.5 10*3/uL (ref 1.4–7.0)
Neutrophils: 60 %
Platelets: 467 10*3/uL — ABNORMAL HIGH (ref 150–450)
RBC: 4.37 x10E6/uL (ref 3.77–5.28)
RDW: 16.6 % — ABNORMAL HIGH (ref 11.7–15.4)
WBC: 7.6 10*3/uL (ref 3.4–10.8)

## 2019-04-04 LAB — IRON AND TIBC
Iron Saturation: 6 % — CL (ref 15–55)
Iron: 19 ug/dL — ABNORMAL LOW (ref 27–159)
Total Iron Binding Capacity: 338 ug/dL (ref 250–450)
UIBC: 319 ug/dL (ref 131–425)

## 2019-04-04 LAB — FERRITIN: Ferritin: 11 ng/mL — ABNORMAL LOW (ref 15–150)

## 2019-04-04 MED ORDER — FERROUS SULFATE 325 (65 FE) MG PO TABS: 325.0000 mg | ORAL_TABLET | ORAL | 3 refills | Status: DC

## 2019-04-04 NOTE — Telephone Encounter (Signed)
Patients labs are consistent with iron deficiency anemia. Will switch her to ferrous sulfate 325 every other day. Advised to take this with a glass of orange juice or something high in vitamin C to help with absorption. She will need repeat iron studies in his CBC in approximately 3 to 6 months.

## 2019-04-05 NOTE — Progress Notes (Signed)
Internal Medicine Clinic Attending  Case discussed with Dr. Helberg at the time of the visit.  We reviewed the resident's history and exam and pertinent patient test results.  I agree with the assessment, diagnosis, and plan of care documented in the resident's note.    

## 2019-04-19 ENCOUNTER — Other Ambulatory Visit: Payer: Self-pay | Admitting: Internal Medicine

## 2019-04-19 DIAGNOSIS — D500 Iron deficiency anemia secondary to blood loss (chronic): Secondary | ICD-10-CM

## 2019-07-20 ENCOUNTER — Other Ambulatory Visit: Payer: Self-pay | Admitting: *Deleted

## 2019-07-20 DIAGNOSIS — I10 Essential (primary) hypertension: Secondary | ICD-10-CM

## 2019-07-21 MED ORDER — LISINOPRIL-HYDROCHLOROTHIAZIDE 10-12.5 MG PO TABS: 1.0000 | ORAL_TABLET | Freq: Every day | ORAL | 2 refills | Status: DC

## 2019-09-04 ENCOUNTER — Encounter: Payer: Self-pay | Admitting: *Deleted

## 2019-09-11 ENCOUNTER — Other Ambulatory Visit: Payer: Self-pay

## 2019-09-11 ENCOUNTER — Emergency Department (HOSPITAL_COMMUNITY)
Admission: EM | Admit: 2019-09-11 | Discharge: 2019-09-11 | Disposition: A | Discharge status: Home / Self Care | Payer: 59 | Attending: Emergency Medicine | Admitting: Emergency Medicine

## 2019-09-11 ENCOUNTER — Emergency Department (HOSPITAL_COMMUNITY): Payer: 59

## 2019-09-11 ENCOUNTER — Encounter (HOSPITAL_COMMUNITY): Payer: Self-pay | Admitting: Pediatrics

## 2019-09-11 DIAGNOSIS — I10 Essential (primary) hypertension: Secondary | ICD-10-CM | POA: Insufficient documentation

## 2019-09-11 DIAGNOSIS — R519 Headache, unspecified: Secondary | ICD-10-CM | POA: Diagnosis present

## 2019-09-11 DIAGNOSIS — Z5321 Procedure and treatment not carried out due to patient leaving prior to being seen by health care provider: Secondary | ICD-10-CM | POA: Diagnosis not present

## 2019-09-11 LAB — CBC WITH DIFFERENTIAL/PLATELET
Abs Immature Granulocytes: 0.03 10*3/uL (ref 0.00–0.07)
Basophils Absolute: 0.1 10*3/uL (ref 0.0–0.1)
Basophils Relative: 1 %
Eosinophils Absolute: 0.3 10*3/uL (ref 0.0–0.5)
Eosinophils Relative: 3 %
HCT: 35.2 % — ABNORMAL LOW (ref 36.0–46.0)
Hemoglobin: 10.4 g/dL — ABNORMAL LOW (ref 12.0–15.0)
Immature Granulocytes: 0 %
Lymphocytes Relative: 26 %
Lymphs Abs: 2.7 10*3/uL (ref 0.7–4.0)
MCH: 22 pg — ABNORMAL LOW (ref 26.0–34.0)
MCHC: 29.5 g/dL — ABNORMAL LOW (ref 30.0–36.0)
MCV: 74.6 fL — ABNORMAL LOW (ref 80.0–100.0)
Monocytes Absolute: 0.6 10*3/uL (ref 0.1–1.0)
Monocytes Relative: 6 %
Neutro Abs: 6.5 10*3/uL (ref 1.7–7.7)
Neutrophils Relative %: 64 %
Platelets: 478 10*3/uL — ABNORMAL HIGH (ref 150–400)
RBC: 4.72 MIL/uL (ref 3.87–5.11)
RDW: 18.4 % — ABNORMAL HIGH (ref 11.5–15.5)
WBC: 10.1 10*3/uL (ref 4.0–10.5)
nRBC: 0 % (ref 0.0–0.2)

## 2019-09-11 LAB — COMPREHENSIVE METABOLIC PANEL
ALT: 19 U/L (ref 0–44)
AST: 22 U/L (ref 15–41)
Albumin: 3.9 g/dL (ref 3.5–5.0)
Alkaline Phosphatase: 72 U/L (ref 38–126)
Anion gap: 12 (ref 5–15)
BUN: 9 mg/dL (ref 6–20)
CO2: 26 mmol/L (ref 22–32)
Calcium: 9.1 mg/dL (ref 8.9–10.3)
Chloride: 99 mmol/L (ref 98–111)
Creatinine, Ser: 0.85 mg/dL (ref 0.44–1.00)
GFR calc Af Amer: >60 mL/min (ref >60)
GFR calc non Af Amer: >60 mL/min (ref >60)
Glucose, Bld: 144 mg/dL — ABNORMAL HIGH (ref 70–99)
Potassium: 3.9 mmol/L (ref 3.5–5.1)
Sodium: 137 mmol/L (ref 135–145)
Total Bilirubin: 0.3 mg/dL (ref 0.3–1.2)
Total Protein: 8.2 g/dL — ABNORMAL HIGH (ref 6.5–8.1)

## 2019-09-11 NOTE — ED Notes (Signed)
Pt notified staff that she is leaving because she has to go pick up her son.

## 2019-09-12 ENCOUNTER — Ambulatory Visit (INDEPENDENT_AMBULATORY_CARE_PROVIDER_SITE_OTHER)
Admission: RE | Admit: 2019-09-12 | Discharge: 2019-09-12 | Disposition: A | Discharge status: Home / Self Care | Payer: 59 | Source: Ambulatory Visit

## 2019-09-12 DIAGNOSIS — M542 Cervicalgia: Secondary | ICD-10-CM

## 2019-09-12 DIAGNOSIS — Z712 Person consulting for explanation of examination or test findings: Secondary | ICD-10-CM

## 2019-09-12 DIAGNOSIS — M25519 Pain in unspecified shoulder: Secondary | ICD-10-CM

## 2019-09-12 MED ORDER — PREDNISONE 10 MG PO TABS
20.0000 mg | ORAL_TABLET | Freq: Every day | ORAL | 0 refills | Status: DC
Start: 2019-09-12 — End: 2020-12-05

## 2019-09-12 NOTE — ED Provider Notes (Signed)
Virtual Visit via Video Note:  Nancy Silva  initiated request for Telemedicine visit with [facility] team. I connected with Nancy Silva  on 09/12/2019 at 10:00AM  for a synchronized telemedicine visit using a video enabled HIPPA compliant telemedicine application. I verified that I am speaking with Nancy Silva  using two identifiers. Emerson Monte, FNP  was physically located in a [facility]gent care site and Kiersten Blaeser was located at a different location.   The limitations of evaluation and management by telemedicine as well as the availability of in-person appointments were discussed. Patient was informed that she  may incur a bill ( including co-pay) for this virtual visit encounter. Nancy Silva  expressed understanding and gave verbal consent to proceed with virtual visit.     History of Present Illness:Nancy Silva  is a 44 y.o. female presents via telehealth with a complaint of follow-up on lab that was completed in the ED yesterday.  Patient reported she left and has not seen an ED provider.  She is also complaining of back and shoulder pain for the past few days.  Denies any precipitating event.  Has tried OTC Tylenol without relief.  Symptoms are made worse with lying down.  Denies similar symptoms in the past.  Denies chills, fever, nausea, vomiting, diarrhea.   Past Medical History:  Diagnosis Date  . Hypertension   . Iron deficiency anemia 11/14/2012  . Menorrhagia with irregular cycle 11/06/2016    Allergies  Allergen Reactions  . Peanuts [Nuts]     Throat swelling  . Shellfish Allergy     Throat Swelling        Observations/Objective: VITALS: Per patient if applicable, see vitals. GENERAL: Alert, appears well and in no acute distress. CARDIOPULMONARY: No increased WOB. Speaking in clear sentences. I:E ratio WNL.  PSYCH: Pleasant and cooperative, well-groomed. Speech normal rate and rhythm. Affect is appropriate.  Insight and judgement are appropriate. Attention is focused, linear, and appropriate.  NEURO:  Oriented as arrived to appointment on time with no prompting.    Assessment and Plan:   ICD-10-CM   1. Encounter to discuss test results  Z71.2   2. Neck pain  M54.2   3. Arthralgia of shoulder, unspecified laterality  M25.519     Follow Up Instructions:  Follow-up with PCP Return for a face-to-face visit if symptom does not resolve      I discussed the assessment and treatment plan with the patient. The patient was provided an opportunity to ask questions and all were answered. The patient agreed with the plan and demonstrated an understanding of the instructions.   The patient was advised to call back or seek an in-person evaluation if the symptoms worsen or if the condition fails to improve as anticipated.  I provided 15 minutes of non-face-to-face time during this encounter.    Emerson Monte, FNP  09/12/2019 10:17 AM         Emerson Monte, FNP 09/12/19 1017

## 2019-09-12 NOTE — Discharge Instructions (Addendum)
Take prednisone as prescribed Continue to take OTC Tylenol as needed for pain Follow-up with PCP Return for a face-to-face visit if symptom does not resolve.

## 2019-09-22 ENCOUNTER — Other Ambulatory Visit: Payer: Self-pay

## 2019-09-22 ENCOUNTER — Ambulatory Visit (INDEPENDENT_AMBULATORY_CARE_PROVIDER_SITE_OTHER): Payer: 59 | Admitting: Internal Medicine

## 2019-09-22 DIAGNOSIS — W19XXXD Unspecified fall, subsequent encounter: Secondary | ICD-10-CM

## 2019-09-22 DIAGNOSIS — I10 Essential (primary) hypertension: Secondary | ICD-10-CM

## 2019-09-22 DIAGNOSIS — D500 Iron deficiency anemia secondary to blood loss (chronic): Secondary | ICD-10-CM

## 2019-09-22 MED ORDER — LISINOPRIL-HYDROCHLOROTHIAZIDE 10-12.5 MG PO TABS: 2.0000 | ORAL_TABLET | Freq: Every day | ORAL | 2 refills | Status: DC

## 2019-09-22 NOTE — Assessment & Plan Note (Signed)
BP Readings from Last 3 Encounters:  09/22/19 (!) 130/92  09/11/19 (!) 152/98  04/03/19 (!) 148/77   Nancy Silva's bp this visit is above goal. She mentions being on her current regimen for a long time. Currently denies any side effects, no cough, rash or orthostatic symptoms.   A/P Bp above goal. Currently on starting dose of lisinopril-hctz. Will increase dose. May benefit from another class but patient express concern regarding pill burden  - Increase to lisinopril-hctz 20-25mg  daily - RTC w/ PCP in a month for BP check/bmp

## 2019-09-22 NOTE — Patient Instructions (Addendum)
Thank you for allowing [place] to provide your care today. Today we discussed your neck pain and anemia    I have ordered no labs for you. I will call if any are abnormal.    Today we made the following changes to your medications.    Please increase your lisinopril-hctz to 20-25mg  daily (two tablets)  I will set up appointment for your to get IV iron infusion.  Please follow-up with your PCP in 6 weeks.    Should you have any questions or concerns please call the internal medicine clinic at [phone].     Cervical Sprain  A cervical sprain is a stretch or tear in the tissues that connect bones (ligaments) in the neck. Most neck (cervical) sprains get better in 4-6 weeks. Follow these instructions at home: If you have a neck collar:  Wear it as told by your doctor. Do not take off (do not remove) the collar unless your doctor says that this is safe.  Ask your doctor before adjusting your collar.  If you have long hair, keep it outside of the collar.  Ask your doctor if you may take off the collar for cleaning and bathing. If you may take off the collar: ? Follow instructions from your doctor about how to take off the collar safely. ? Clean the collar by wiping it with mild soap and water. Let it air-dry all the way. ? If your collar has removable pads:  Take the pads out every 1-2 days.  Hand wash the pads with soap and water.  Let the pads air-dry all the way before you put them back in the collar. Do not dry them in a clothes dryer. Do not dry them with a hair dryer. ? Check your skin under the collar for irritation or sores. If you see any, tell your doctor. Managing pain, stiffness, and swelling   Use a cervical traction device, if told by your doctor.  If told, put heat on the affected area. Do this before exercises (physical therapy) or as often as told by your doctor. Use the heat source that your doctor recommends, such as a moist heat pack or a heating pad. ? Place  a towel between your skin and the heat source. ? Leave the heat on for 20-30 minutes. ? Take the heat off (remove the heat) if your skin turns bright red. This is very important if you cannot feel pain, heat, or cold. You may have a greater risk of getting burned.  Put ice on the affected area. ? Put ice in a plastic bag. ? Place a towel between your skin and the bag. ? Leave the ice on for 20 minutes, 2-3 times a day. Activity  Do not drive while wearing a neck collar. If you do not have a neck collar, ask your doctor if it is safe to drive.  Do not drive or use heavy machinery while taking prescription pain medicine or muscle relaxants, unless your doctor approves.  Do not lift anything that is heavier than 10 lb (4.5 kg) until your doctor tells you that it is safe.  Rest as told by your doctor.  Avoid activities that make you feel worse. Ask your doctor what activities are safe for you.  Do exercises as told by your doctor or physical therapist. Preventing neck sprain  Practice good posture. Adjust your workstation to help with this, if needed.  Exercise regularly as told by your doctor or physical therapist.  Avoid activities  that are risky or may cause a neck sprain (cervical sprain). General instructions  Take over-the-counter and prescription medicines only as told by your doctor.  Do not use any products that contain nicotine or tobacco. This includes cigarettes and e-cigarettes. If you need help quitting, ask your doctor.  Keep all follow-up visits as told by your doctor. This is important. Contact a doctor if:  You have pain or other symptoms that get worse.  You have symptoms that do not get better after 2 weeks.  You have pain that does not get better with medicine.  You start to have new, unexplained symptoms.  You have sores or irritated skin from wearing your neck collar. Get help right away if:  You have very bad pain.  You have any of the following  in any part of your body: ? Loss of feeling (numbness). ? Tingling. ? Weakness.  You cannot move a part of your body (you have paralysis).  Your activity level does not improve. Summary  A cervical sprain is a stretch or tear in the tissues that connect bones (ligaments) in the neck.  If you have a neck (cervical) collar, do not take off the collar unless your doctor says that this is safe.  Put ice on affected areas as told by your doctor.  Put heat on affected areas as told by your doctor.  Good posture and regular exercise can help prevent a neck sprain from happening again. This information is not intended to replace advice given to you by your health care provider. Make sure you discuss any questions you have with your health care provider. Document Revised: 08/03/2018 Document Reviewed: 12/24/2015 Elsevier Patient Education  [employer].

## 2019-09-22 NOTE — Assessment & Plan Note (Signed)
Lab Results  Component Value Date   WBC 10.1 09/11/2019   HGB 10.4 (L) 09/11/2019   HCT 35.2 (L) 09/11/2019   MCV 74.6 (L) 09/11/2019   PLT 478 (H) 09/11/2019    Per chart review, recent cbc from urgent care shows chronic microcytic anemia. Previously diagnosed with iron deficiency anemia and prescribed oral iron supplementation. She mentions good adherence to iron regimen. States she has hx of heavy menstrual bleeding and continues to endorse heavy bleeding.  A/P Iron deficiency anemia refractory to oral supplementation. Will set her up for IV iron. - IV Ferriheme ordered for short stay unit on 09/29/19 @ 1pm

## 2019-09-22 NOTE — Progress Notes (Signed)
Internal Medicine Clinic Attending ° °Case discussed with Dr. Lee at the time of the visit.  We reviewed the resident’s history and exam and pertinent patient test results.  I agree with the assessment, diagnosis, and plan of care documented in the resident’s note.  °

## 2019-09-22 NOTE — Progress Notes (Signed)
CC: Neck pain  HPI: Ms.Dorothia Trindle is a 44 y.o. with PMH listed below presenting with complaint of neck pain. Please see problem based assessment and plan for further details.  Past Medical History:  Diagnosis Date  . Hypertension   . Iron deficiency anemia 11/14/2012  . Menorrhagia with irregular cycle 11/06/2016    Review of Systems: Review of Systems  Constitutional: Negative for chills, fever and malaise/fatigue.  Eyes: Negative for blurred vision.  Respiratory: Negative for shortness of breath.   Cardiovascular: Negative for chest pain, palpitations and leg swelling.  Gastrointestinal: Negative for constipation, diarrhea, nausea and vomiting.  Musculoskeletal: Positive for joint pain.  Neurological: Positive for sensory change (LUE peripheral numbness). Negative for dizziness and headaches.     Physical Exam: Vitals:   09/22/19 0915 09/22/19 0922  BP: (!) 138/100 (!) 130/92  Pulse: 94 85  Temp: 98.2 F (36.8 C)   TempSrc: Oral   SpO2: 100%   Weight: 249 lb 6.4 oz (113.1 kg)   Height: 5\' 7"  (1.702 m)     Physical Exam  Constitutional: She is oriented to person, place, and time. She appears well-developed and well-nourished. No distress.  HENT:  Mouth/Throat: Oropharynx is clear and moist.  Eyes: Conjunctivae are normal.  Neck: No tracheal deviation present. No thyromegaly present.  ROM intact no focal tenderness to palpation  Cardiovascular: Normal rate, regular rhythm, normal heart sounds and intact distal pulses.  No murmur heard. Respiratory: Effort normal and breath sounds normal. She has no wheezes. She has no rales.  GI: Soft. Bowel sounds are normal. She exhibits no distension. There is no abdominal tenderness.  Musculoskeletal:        General: No tenderness or edema. Normal range of motion.     Cervical back: Normal range of motion and neck supple.     Comments: Negative L shoulder Neer test, negative empty can test  Lymphadenopathy:    She has  no cervical adenopathy.  Neurological: She is alert and oriented to person, place, and time.  Skin: Skin is warm and dry.    Assessment & Plan:   Essential hypertension BP Readings from Last 3 Encounters:  09/22/19 (!) 130/92  09/11/19 (!) 152/98  04/03/19 (!) 148/77   Ms.Doverspike's bp this visit is above goal. She mentions being on her current regimen for a long time. Currently denies any side effects, no cough, rash or orthostatic symptoms.   A/P Bp above goal. Currently on starting dose of lisinopril-hctz. Will increase dose. May benefit from another class but patient express concern regarding pill burden  - Increase to lisinopril-hctz 20-25mg  daily - RTC w/ PCP in a month for BP check/bmp  Fall Ms.Moorehead is a 44 yo F w/ PMH of IDA, HTN presenting to [facility] w/ complaint of neck and shoulder pain. She was in her usual state of health until October of last year when she fell while in getting out of the tub and landed head first. She mentions having nagging neck and shoulder pain at the time but not seeking medical care. She mentions that recently she has noticed change in quality of the pain including associated left sided numbness and 'popping' sensation on rotation of her neck. She mentions noticing the pain more frequently at work and visited urgent care recently. She mentions being prescribed short course of prednisone and had imaging done at the time and currently she is symptom free but wanted to be evaluated as she wonder if she needs further work-up. She  also mentions pain of her shoulder that feels 'deep in the joint.' Denies any fevers, chills, nausea, vomiting, blurry vision, weight loss.  A/P Present w/ complaint of chronic neck and shoulder pain assumed to sequelae of fall from 2020. Currently symptom free after prednisone prescribed from urgent care. Denies any hx of malignancy. CT brain from urgent care w/o acute abnormality. Can be treated w/ supportive care. - Counseled on  adverse effects of long term prednisone use - NSAIDs prn  Iron deficiency anemia Lab Results  Component Value Date   WBC 10.1 09/11/2019   HGB 10.4 (L) 09/11/2019   HCT 35.2 (L) 09/11/2019   MCV 74.6 (L) 09/11/2019   PLT 478 (H) 09/11/2019    Per chart review, recent cbc from urgent care shows chronic microcytic anemia. Previously diagnosed with iron deficiency anemia and prescribed oral iron supplementation. She mentions good adherence to iron regimen. States she has hx of heavy menstrual bleeding and continues to endorse heavy bleeding.  A/P Iron deficiency anemia refractory to oral supplementation. Will set her up for IV iron. - IV Ferriheme ordered for short stay unit on 09/29/19 @ 1pm    Patient discussed with Dr. Evette Doffing   -Gilberto Better, PGY2 [facility] Pager: [phone]

## 2019-09-22 NOTE — Assessment & Plan Note (Addendum)
Nancy Silva is a 44 yo F w/ PMH of IDA, HTN presenting to [facility] w/ complaint of neck and shoulder pain. She was in her usual state of health until October of last year when she fell while in getting out of the tub and landed head first. She mentions having nagging neck and shoulder pain at the time but not seeking medical care. She mentions that recently she has noticed change in quality of the pain including associated left sided numbness and 'popping' sensation on rotation of her neck. She mentions noticing the pain more frequently at work and visited urgent care recently. She mentions being prescribed short course of prednisone and had imaging done at the time and currently she is symptom free but wanted to be evaluated as she wonder if she needs further work-up. She also mentions pain of her shoulder that feels 'deep in the joint.' Denies any fevers, chills, nausea, vomiting, blurry vision, weight loss.  A/P Present w/ complaint of chronic neck and shoulder pain assumed to sequelae of fall from 2020. Currently symptom free after prednisone prescribed from urgent care. Denies any hx of malignancy. CT brain from urgent care w/o acute abnormality. Can be treated w/ supportive care. - Counseled on adverse effects of long term prednisone use - NSAIDs prn

## 2019-09-29 ENCOUNTER — Other Ambulatory Visit: Payer: Self-pay

## 2019-09-29 ENCOUNTER — Ambulatory Visit (HOSPITAL_COMMUNITY)
Admission: RE | Admit: 2019-09-29 | Discharge: 2019-09-29 | Disposition: A | Discharge status: Home / Self Care | Payer: 59 | Source: Ambulatory Visit | Attending: Student in an Organized Health Care Education/Training Program | Admitting: Student in an Organized Health Care Education/Training Program

## 2019-09-29 DIAGNOSIS — D500 Iron deficiency anemia secondary to blood loss (chronic): Secondary | ICD-10-CM | POA: Diagnosis not present

## 2019-09-29 MED ORDER — FERUMOXYTOL INJECTION 510 MG/17 ML: 510.0000 mg | Freq: Once | INTRAVENOUS | 0 refills | Status: DC

## 2019-09-29 MED ORDER — SODIUM CHLORIDE 0.9 % IV SOLN
510.0000 mg | Freq: Once | INTRAVENOUS | Status: AC
  Administered 2019-09-29: 510 mg via INTRAVENOUS
  Filled 2019-09-29: qty 510

## 2019-09-29 NOTE — Discharge Instructions (Signed)

## 2019-10-26 ENCOUNTER — Ambulatory Visit (INDEPENDENT_AMBULATORY_CARE_PROVIDER_SITE_OTHER): Payer: 59 | Admitting: Internal Medicine

## 2019-10-26 ENCOUNTER — Encounter: Payer: Self-pay | Admitting: Internal Medicine

## 2019-10-26 VITALS — BP 137/78 | HR 100 | Temp 99.0°F | Ht 67.0 in | Wt 244.9 lb

## 2019-10-26 DIAGNOSIS — I10 Essential (primary) hypertension: Secondary | ICD-10-CM

## 2019-10-26 DIAGNOSIS — W19XXXD Unspecified fall, subsequent encounter: Secondary | ICD-10-CM

## 2019-10-26 DIAGNOSIS — M25551 Pain in right hip: Secondary | ICD-10-CM | POA: Insufficient documentation

## 2019-10-26 DIAGNOSIS — M25552 Pain in left hip: Secondary | ICD-10-CM

## 2019-10-26 DIAGNOSIS — D500 Iron deficiency anemia secondary to blood loss (chronic): Secondary | ICD-10-CM | POA: Diagnosis not present

## 2019-10-26 DIAGNOSIS — R310 Gross hematuria: Secondary | ICD-10-CM | POA: Insufficient documentation

## 2019-10-26 MED ORDER — IBUPROFEN 800 MG PO TABS: 800.0000 mg | ORAL_TABLET | Freq: Three times a day (TID) | ORAL | 0 refills | Status: DC | PRN

## 2019-10-26 NOTE — Assessment & Plan Note (Addendum)
Nancy Silva mentions that prior to coming to clinic visit, she noticed darkening of her urine with feeling that she 'passed something' while urinating. Denies any prior hx of nephrolithiasis or flank pain, although she is currently endorsing hip pain. She mentions that she is also currently having irregular cycles as she is peri-menopausal. She tried to provide urine sample in clinic was unable due to having to leave the clinic urgently to pick up her children from day care.  - Future order for urine analysis placed.

## 2019-10-26 NOTE — Assessment & Plan Note (Signed)
BP Readings from Last 3 Encounters:  10/26/19 137/78  09/29/19 134/79  09/22/19 (!) 130/92   Presents w/ f/u visit for htn. Better controlled after increasing lisinopril-hctz dose but currently in pain due to hip issues. Will need to re-check once acute issue is resolved. Denies any cough, rash, orthostatic symptoms.  - C/w lisinopril-hctz 20-25mg  daily

## 2019-10-26 NOTE — Progress Notes (Addendum)
CC: Hip pain  HPI: [name] is a 44 y.o. with PMH listed below presenting with complaint of hip pain. Please see problem based assessment and plan for further details.  Past Medical History:  Diagnosis Date  . Hypertension   . Iron deficiency anemia 11/14/2012  . Menorrhagia with irregular cycle 11/06/2016    Review of Systems: Review of Systems  Constitutional: Negative for chills, fever and malaise/fatigue.  Eyes: Negative for blurred vision.  Respiratory: Negative for cough and shortness of breath.   Cardiovascular: Negative for chest pain, palpitations and leg swelling.  Gastrointestinal: Negative for constipation, diarrhea, nausea and vomiting.  Genitourinary: Positive for hematuria (Also started menstrual cycle). Negative for dysuria and urgency.  Musculoskeletal: Positive for joint pain. Negative for falls.  Neurological: Negative for dizziness and headaches.  All other systems reviewed and are negative.    Physical Exam: Vitals:   10/26/19 1529  BP: 137/78  Pulse: 100  Temp: 99 F (37.2 C)  TempSrc: Oral  SpO2: 100%  Weight: 244 lb 14.4 oz (111.1 kg)  Height: 5\' 7"  (1.702 m)    Physical Exam Constitutional:      General: She is not in acute distress.    Appearance: Normal appearance.  HENT:     Head: Normocephalic and atraumatic.     Mouth/Throat:     Mouth: Mucous membranes are moist.     Pharynx: Oropharynx is clear.  Eyes:     Conjunctiva/sclera: Conjunctivae normal.  Cardiovascular:     Rate and Rhythm: Normal rate and regular rhythm.     Pulses: Normal pulses.     Heart sounds: Normal heart sounds. No murmur heard.   Pulmonary:     Effort: Pulmonary effort is normal.     Breath sounds: Normal breath sounds. No wheezing or rales.  Abdominal:     General: Abdomen is flat. Bowel sounds are normal.     Palpations: Abdomen is soft.     Tenderness: There is no abdominal tenderness. There is no right CVA tenderness or left CVA  tenderness.  Musculoskeletal:        General: Tenderness (Bilateral hip passive and active abduction limited by pain. Negative straight leg tests bilaterally, external/internal rotation intact) present. No swelling.  Skin:    General: Skin is warm and dry.  Neurological:     Mental Status: She is alert and oriented to person, place, and time.    Assessment & Plan:   Acute hip pain, bilateral Nancy Silva is a 44 yo F w/ PMH of HTN, fibroids, and iron deficiency anemia presenting to [facility] w/ complaints of acute hip pain. She was in her usual state of health until 2 weeks ago when she developed severe bilateral hip pain without obvious inciting event. She describes the pain as 'grinding' pain deep in her hips bilaterally. Denies prior history. Denies exacerbating factors. Does have mild improvement with Alleve and Tylenol. Mentions no difference in severity in morning or night. Not alleviated or exacerbated by exercise or exertion. Denies any radiation to groin or down her legs. Denies any incontinence or focal weakness. Denies any fevers, chills. She mentions that she has had multiple family members with rheumatoid arthritis and wonders if this may be the cause.  A/P Presents w/ acute onset bilateral hip pain. Unclear etiology. ROM limited by tenderness but currently weight bearing. Lack of improvement with exertion and lack of other joint involvement make RA less likely but cannot r/o. Suspect osteoarthritis due to obesity but will get  X-ray to start work-up.  - DG Pelvis  Essential hypertension BP Readings from Last 3 Encounters:  10/26/19 137/78  09/29/19 134/79  09/22/19 (!) 130/92   Presents w/ f/u visit for htn. Silva controlled after increasing lisinopril-hctz dose but currently in pain due to hip issues. Will need to re-check once acute issue is resolved. Denies any cough, rash, orthostatic symptoms.  - C/w lisinopril-hctz 20-25mg  daily   Gross hematuria Nancy Silva mentions that  prior to coming to clinic visit, she noticed darkening of her urine with feeling that she 'passed something' while urinating. Denies any prior hx of nephrolithiasis or flank pain, although she is currently endorsing hip pain. She mentions that she is also currently having irregular cycles as she is peri-menopausal. She tried to provide urine sample in clinic was unable due to having to leave the clinic urgently to pick up her children from day care.  - Future order for urine analysis placed.  Iron deficiency anemia Patient present w/ hx of heavy menstrual bleeding. Mentions some improvement in fatigue and weakness after receiving 1x Feraheme dose in June. States that she had noticed weakness and fatigue returning after 1 week. Due for additional dose as it has been 1 week since last dose.  - IV iron 2nd dose scheduled with short stay unit on 10/11/19 @ 9am   Patient discussed with Dr. Dareen Piano  -Nancy Silva, [place] [place] [facility] Pager: [phone]

## 2019-10-26 NOTE — Patient Instructions (Addendum)
Thank you for allowing [place] to provide your care today. Today we discussed your hip pain    I have ordered no labs for you. I will call if any are abnormal.    Today we made the following changes to your medications.    Please take ibuprofen 800mg  as needed  Please follow-up in 2 weeks.    Should you have any questions or concerns please call the internal medicine clinic at [phone].     Hip Pain The hip is the joint between the upper legs and the lower pelvis. The bones, cartilage, tendons, and muscles of your hip joint support your body and allow you to move around. Hip pain can range from a minor ache to severe pain in one or both of your hips. The pain may be felt on the inside of the hip joint near the groin, or on the outside near the buttocks and upper thigh. You may also have swelling or stiffness in your hip area. Follow these instructions at home: Managing pain, stiffness, and swelling      If directed, put ice on the painful area. To do this: ? Put ice in a plastic bag. ? Place a towel between your skin and the bag. ? Leave the ice on for 20 minutes, 2-3 times a day.  If directed, apply heat to the affected area as often as told by your health care provider. Use the heat source that your health care provider recommends, such as a moist heat pack or a heating pad. ? Place a towel between your skin and the heat source. ? Leave the heat on for 20-30 minutes. ? Remove the heat if your skin turns bright red. This is especially important if you are unable to feel pain, heat, or cold. You may have a greater risk of getting burned. Activity  Do exercises as told by your health care provider.  Avoid activities that cause pain. General instructions   Take over-the-counter and prescription medicines only as told by your health care provider.  Keep a journal of your symptoms. Write down: ? How often you have hip pain. ? The location of your pain. ? What the pain feels  like. ? What makes the pain worse.  Sleep with a pillow between your legs on your most comfortable side.  Keep all follow-up visits as told by your health care provider. This is important. Contact a health care provider if:  You cannot put weight on your leg.  Your pain or swelling continues or gets worse after one week.  It gets harder to walk.  You have a fever. Get help right away if:  You fall.  You have a sudden increase in pain and swelling in your hip.  Your hip is red or swollen or very tender to touch. Summary  Hip pain can range from a minor ache to severe pain in one or both of your hips.  The pain may be felt on the inside of the hip joint near the groin, or on the outside near the buttocks and upper thigh.  Avoid activities that cause pain.  Write down how often you have hip pain, the location of the pain, what makes it worse, and what it feels like. This information is not intended to replace advice given to you by your health care provider. Make sure you discuss any questions you have with your health care provider. Document Revised: 08/29/2018 Document Reviewed: 08/29/2018 Elsevier Patient Education  [place].

## 2019-10-26 NOTE — Assessment & Plan Note (Signed)
Nancy Silva is a 44 yo F w/ PMH of HTN, fibroids, and iron deficiency anemia presenting to [facility] w/ complaints of acute hip pain. She was in her usual state of health until 2 weeks ago when she developed severe bilateral hip pain without obvious inciting event. She describes the pain as 'grinding' pain deep in her hips bilaterally. Denies prior history. Denies exacerbating factors. Does have mild improvement with Alleve and Tylenol. Mentions no difference in severity in morning or night. Not alleviated or exacerbated by exercise or exertion. Denies any radiation to groin or down her legs. Denies any incontinence or focal weakness. Denies any fevers, chills. She mentions that she has had multiple family members with rheumatoid arthritis and wonders if this may be the cause.  A/P Presents w/ acute onset bilateral hip pain. Unclear etiology. ROM limited by tenderness but currently weight bearing. Lack of improvement with exertion and lack of other joint involvement make RA less likely but cannot r/o. Suspect osteoarthritis due to obesity but will get X-ray to start work-up.  - DG Pelvis

## 2019-10-27 ENCOUNTER — Other Ambulatory Visit: Payer: Self-pay | Admitting: Internal Medicine

## 2019-10-27 NOTE — Progress Notes (Signed)
Order for [facility] for Iron infusion placed.

## 2019-10-27 NOTE — Assessment & Plan Note (Signed)
Patient present w/ hx of heavy menstrual bleeding. Mentions some improvement in fatigue and weakness after receiving 1x Feraheme dose in June. States that she had noticed weakness and fatigue returning after 1 week. Due for additional dose as it has been 1 week since last dose.  - IV iron 2nd dose scheduled with short stay unit on 10/11/19 @ 9am

## 2019-10-27 NOTE — Progress Notes (Signed)
Internal Medicine Clinic Attending ° °Case discussed with Dr. Lee at the time of the visit.  We reviewed the resident’s history and exam and pertinent patient test results.  I agree with the assessment, diagnosis, and plan of care documented in the resident’s note.  °

## 2019-11-01 ENCOUNTER — Other Ambulatory Visit: Payer: Self-pay | Admitting: Internal Medicine

## 2019-11-01 DIAGNOSIS — I10 Essential (primary) hypertension: Secondary | ICD-10-CM

## 2019-11-10 ENCOUNTER — Other Ambulatory Visit: Payer: Self-pay | Admitting: *Deleted

## 2019-11-10 ENCOUNTER — Other Ambulatory Visit: Payer: Self-pay

## 2019-11-10 ENCOUNTER — Ambulatory Visit (HOSPITAL_COMMUNITY)
Admission: RE | Admit: 2019-11-10 | Discharge: 2019-11-10 | Disposition: A | Discharge status: Home / Self Care | Payer: 59 | Source: Ambulatory Visit | Attending: Internal Medicine | Admitting: Internal Medicine

## 2019-11-10 ENCOUNTER — Other Ambulatory Visit: Payer: 59

## 2019-11-10 DIAGNOSIS — D509 Iron deficiency anemia, unspecified: Secondary | ICD-10-CM | POA: Insufficient documentation

## 2019-11-10 DIAGNOSIS — R310 Gross hematuria: Secondary | ICD-10-CM

## 2019-11-10 MED ORDER — SODIUM CHLORIDE 0.9 % IV SOLN
510.0000 mg | Freq: Once | INTRAVENOUS | Status: AC
  Administered 2019-11-10: 510 mg via INTRAVENOUS
  Filled 2019-11-10: qty 17

## 2019-11-10 MED ORDER — FERUMOXYTOL INJECTION 510 MG/17 ML: 510.0000 mg | Freq: Once | INTRAVENOUS | 0 refills | Status: DC

## 2019-11-11 LAB — URINALYSIS, ROUTINE W REFLEX MICROSCOPIC
Bilirubin, UA: NEGATIVE
Glucose, UA: NEGATIVE
Ketones, UA: NEGATIVE
Leukocytes,UA: NEGATIVE
Nitrite, UA: NEGATIVE
Protein,UA: NEGATIVE
RBC, UA: NEGATIVE
Specific Gravity, UA: <=1.005 — AB (ref 1.005–1.030)
Urobilinogen, Ur: 0.2 mg/dL (ref 0.2–1.0)
pH, UA: 7 (ref 5.0–7.5)

## 2019-11-13 ENCOUNTER — Encounter: Payer: 59 | Admitting: Internal Medicine

## 2019-11-14 ENCOUNTER — Other Ambulatory Visit: Payer: Self-pay

## 2019-11-14 ENCOUNTER — Encounter: Payer: Self-pay | Admitting: Obstetrics & Gynecology

## 2019-11-14 ENCOUNTER — Other Ambulatory Visit (HOSPITAL_COMMUNITY)
Admission: RE | Admit: 2019-11-14 | Discharge: 2019-11-14 | Disposition: A | Discharge status: Home / Self Care | Payer: 59 | Source: Ambulatory Visit | Attending: Obstetrics & Gynecology | Admitting: Obstetrics & Gynecology

## 2019-11-14 ENCOUNTER — Ambulatory Visit (INDEPENDENT_AMBULATORY_CARE_PROVIDER_SITE_OTHER): Payer: 59 | Admitting: Obstetrics & Gynecology

## 2019-11-14 VITALS — BP 149/90 | HR 76 | Wt 242.6 lb

## 2019-11-14 DIAGNOSIS — Z01419 Encounter for gynecological examination (general) (routine) without abnormal findings: Secondary | ICD-10-CM | POA: Diagnosis not present

## 2019-11-14 DIAGNOSIS — N920 Excessive and frequent menstruation with regular cycle: Secondary | ICD-10-CM | POA: Diagnosis not present

## 2019-11-14 DIAGNOSIS — Z8041 Family history of malignant neoplasm of ovary: Secondary | ICD-10-CM | POA: Diagnosis not present

## 2019-11-14 DIAGNOSIS — Z803 Family history of malignant neoplasm of breast: Secondary | ICD-10-CM | POA: Diagnosis not present

## 2019-11-14 MED ORDER — NAPROXEN 500 MG PO TABS: 500.0000 mg | ORAL_TABLET | Freq: Two times a day (BID) | ORAL | 2 refills | Status: DC

## 2019-11-14 MED ORDER — TRANEXAMIC ACID 650 MG PO TABS: 1300.0000 mg | ORAL_TABLET | Freq: Three times a day (TID) | ORAL | 2 refills | Status: DC

## 2019-11-14 NOTE — Patient Instructions (Addendum)
Abnormal Uterine Bleeding Abnormal uterine bleeding is unusual bleeding from the uterus. It includes:  Bleeding or spotting between periods.  Bleeding after sex.  Bleeding that is heavier than normal.  Periods that last longer than usual.  Bleeding after menopause. Abnormal uterine bleeding can affect women at various stages in life, including teenagers, women in their reproductive years, pregnant women, and women who have reached menopause. Common causes of abnormal uterine bleeding include:  Pregnancy.  Growths of tissue (polyps).  A noncancerous tumor in the uterus (fibroid).  Infection.  Cancer.  Hormonal imbalances. Any type of abnormal bleeding should be evaluated by a health care provider. Many cases are minor and simple to treat, while others are more serious. Treatment will depend on the cause of the bleeding. Follow these instructions at home:  Monitor your condition for any changes.  Do not use tampons, douche, or have sex if told by your health care provider.  Change your pads often.  Get regular exams that include pelvic exams and cervical cancer screening.  Keep all follow-up visits as told by your health care provider. This is important. Contact a health care provider if:  Your bleeding lasts for more than one week.  You feel dizzy at times.  You feel nauseous or you vomit. Get help right away if:  You pass out.  Your bleeding soaks through a pad every hour.  You have abdominal pain.  You have a fever.  You become sweaty or weak.  You pass large blood clots from your vagina. Summary  Abnormal uterine bleeding is unusual bleeding from the uterus.  Any type of abnormal bleeding should be evaluated by a health care provider. Many cases are minor and simple to treat, while others are more serious.  Treatment will depend on the cause of the bleeding. This information is not intended to replace advice given to you by your health care provider.  Make sure you discuss any questions you have with your health care provider. Document Revised: 07/21/2017 Document Reviewed: 05/15/2016 Elsevier Patient Education  2020 [employer].  Endometrial Biopsy  Endometrial biopsy is a procedure in which a tissue sample is taken from inside the uterus. The sample is taken from the endometrium, which is the lining of the uterus. The tissue sample is then checked under a microscope to see if the tissue is normal or abnormal. This procedure helps to determine where you are in your menstrual cycle and how hormone levels are affecting the lining of the uterus. This procedure may also be used to evaluate uterine bleeding or to diagnose endometrial cancer, endometrial tuberculosis, polyps, or other inflammatory conditions. Tell a health care provider about:  Any allergies you have.  All medicines you are taking, including vitamins, herbs, eye drops, creams, and over-the-counter medicines.  Any problems you or family members have had with anesthetic medicines.  Any blood disorders you have.  Any surgeries you have had.  Any medical conditions you have.  Whether you are pregnant or may be pregnant. What are the risks? Generally, this is a safe procedure. However, problems may occur, including:  Bleeding.  Pelvic infection.  Puncture of the wall of the uterus with the biopsy device (rare). What happens before the procedure?  Keep a record of your menstrual cycles as told by your health care provider. You may need to schedule your procedure for a specific time in your cycle.  You may want to bring a sanitary pad to wear after the procedure.  Ask your  health care provider about: ? Changing or stopping your regular medicines. This is especially important if you are taking diabetes medicines or blood thinners. ? Taking medicines such as aspirin and ibuprofen. These medicines can thin your blood. Do not take these medicines before your procedure if  your health care provider instructs you not to.  Plan to have someone take you home from the hospital or clinic. What happens during the procedure?  To lower your risk of infection: ? Your health care team will wash or sanitize their hands.  You will lie on an exam table with your feet and legs supported as in a pelvic exam.  Your health care provider will insert an instrument (speculum) into your vagina to see your cervix.  Your cervix will be cleansed with an antiseptic solution.  A medicine (local anesthetic) will be used to numb the cervix.  A forceps instrument (tenaculum) will be used to hold your cervix steady for the biopsy.  A thin, rod-like instrument (uterine sound) will be inserted through your cervix to determine the length of your uterus and the location where the biopsy sample will be removed.  A thin, flexible tube (catheter) will be inserted through your cervix and into the uterus. The catheter will be used to collect the biopsy sample from your endometrial tissue.  The catheter and speculum will then be removed, and the tissue sample will be sent to a lab for examination. What happens after the procedure?  You will rest in a recovery area until you are ready to go home.  You may have mild cramping and a small amount of vaginal bleeding. This is normal.  It is up to you to get the results of your procedure. Ask your health care provider, or the department that is doing the procedure, when your results will be ready. Summary  Endometrial biopsy is a procedure in which a tissue sample is taken from the endometrium, which is the lining of the uterus.  This procedure may help to diagnose menstrual cycle problems, abnormal bleeding, or other conditions affecting the endometrium.  Before the procedure, keep a record of your menstrual cycles as told by your health care provider.  The tissue sample that is removed will be checked under a microscope to see if it is  normal or abnormal. This information is not intended to replace advice given to you by your health care provider. Make sure you discuss any questions you have with your health care provider. Document Revised: 03/26/2017 Document Reviewed: 04/29/2016 Elsevier Patient Education  2020 [employer].   Endometrial Ablation Endometrial ablation is a procedure that destroys the thin inner layer of the lining of the uterus (endometrium). This procedure may be done:  To stop heavy periods.  To stop bleeding that is causing anemia.  To control irregular bleeding.  To treat bleeding caused by small tumors (fibroids) in the endometrium. This procedure is often an alternative to major surgery, such as removal of the uterus and cervix (hysterectomy). As a result of this procedure:  You may not be able to have children. However, if you are premenopausal (you have not gone through menopause): ? You may still have a small chance of getting pregnant. ? You will need to use a reliable method of birth control after the procedure to prevent pregnancy.  You may stop having a menstrual period, or you may have only a small amount of bleeding during your period. Menstruation may return several years after the procedure. Tell a  health care provider about:  Any allergies you have.  All medicines you are taking, including vitamins, herbs, eye drops, creams, and over-the-counter medicines.  Any problems you or family members have had with the use of anesthetic medicines.  Any blood disorders you have.  Any surgeries you have had.  Any medical conditions you have. What are the risks? Generally, this is a safe procedure. However, problems may occur, including:  A hole (perforation) in the uterus or bowel.  Infection of the uterus, bladder, or vagina.  Bleeding.  Damage to other structures or organs.  An air bubble in the lung (air embolus).  Problems with pregnancy after the procedure.  Failure  of the procedure.  Decreased ability to diagnose cancer in the endometrium. What happens before the procedure?  You will have tests of your endometrium to make sure there are no pre-cancerous cells or cancer cells present.  You may have an ultrasound of the uterus.  You may be given medicines to thin the endometrium.  Ask your health care provider about: ? Changing or stopping your regular medicines. This is especially important if you take diabetes medicines or blood thinners. ? Taking medicines such as aspirin and ibuprofen. These medicines can thin your blood. Do not take these medicines before your procedure if your doctor tells you not to.  Plan to have someone take you home from the hospital or clinic. What happens during the procedure?   You will lie on an exam table with your feet and legs supported as in a pelvic exam.  To lower your risk of infection: ? Your health care team will wash or sanitize their hands and put on germ-free (sterile) gloves. ? Your genital area will be washed with soap.  An IV tube will be inserted into one of your veins.  You will be given a medicine to help you relax (sedative).  A surgical instrument with a light and camera (resectoscope) will be inserted into your vagina and moved into your uterus. This allows your surgeon to see inside your uterus.  Endometrial tissue will be removed using one of the following methods: ? Radiofrequency. This method uses a radiofrequency-alternating electric current to remove the endometrium. ? Cryotherapy. This method uses extreme cold to freeze the endometrium. ? Heated-free liquid. This method uses a heated saltwater (saline) solution to remove the endometrium. ? Microwave. This method uses high-energy microwaves to heat up the endometrium and remove it. ? Thermal balloon. This method involves inserting a catheter with a balloon tip into the uterus. The balloon tip is filled with heated fluid to remove the  endometrium. The procedure may vary among health care providers and hospitals. What happens after the procedure?  Your blood pressure, heart rate, breathing rate, and blood oxygen level will be monitored until the medicines you were given have worn off.  As tissue healing occurs, you may notice vaginal bleeding for 4-6 weeks after the procedure. You may also experience: ? Cramps. ? Thin, watery vaginal discharge that is light pink or brown in color. ? A need to urinate more frequently than usual. ? Nausea.  Do not drive for 24 hours if you were given a sedative.  Do not have sex or insert anything into your vagina until your health care provider approves. Summary  Endometrial ablation is done to treat the many causes of heavy menstrual bleeding.  The procedure may be done only after medications have been tried to control the bleeding.  Plan to have someone take  you home from the hospital or clinic. This information is not intended to replace advice given to you by your health care provider. Make sure you discuss any questions you have with your health care provider. Document Revised: 09/28/2017 Document Reviewed: 04/30/2016 Elsevier Patient Education  2020 [employer] Inc.    Preventive Care 71-3 Years Old, Female Preventive care refers to visits with your health care provider and lifestyle choices that can promote health and wellness. This includes:  A yearly physical exam. This may also be called an annual well check.  Regular dental visits and eye exams.  Immunizations.  Screening for certain conditions.  Healthy lifestyle choices, such as eating a healthy diet, getting regular exercise, not using drugs or products that contain nicotine and tobacco, and limiting alcohol use. What can I expect for my preventive care visit? Physical exam Your health care provider will check your:  Height and weight. This may be used to calculate body mass index (BMI), which tells if you  are at a healthy weight.  Heart rate and blood pressure.  Skin for abnormal spots. Counseling Your health care provider may ask you questions about your:  Alcohol, tobacco, and drug use.  Emotional well-being.  Home and relationship well-being.  Sexual activity.  Eating habits.  Work and work Statistician.  Method of birth control.  Menstrual cycle.  Pregnancy history. What immunizations do I need?  Influenza (flu) vaccine  This is recommended every year. Tetanus, diphtheria, and pertussis (Tdap) vaccine  You may need a Td booster every 10 years. Varicella (chickenpox) vaccine  You may need this if you have not been vaccinated. Zoster (shingles) vaccine  You may need this after age 72. Measles, mumps, and rubella (MMR) vaccine  You may need at least one dose of MMR if you were born in 1957 or later. You may also need a second dose. Pneumococcal conjugate (PCV13) vaccine  You may need this if you have certain conditions and were not previously vaccinated. Pneumococcal polysaccharide (PPSV23) vaccine  You may need one or two doses if you smoke cigarettes or if you have certain conditions. Meningococcal conjugate (MenACWY) vaccine  You may need this if you have certain conditions. Hepatitis A vaccine  You may need this if you have certain conditions or if you travel or work in places where you may be exposed to hepatitis A. Hepatitis B vaccine  You may need this if you have certain conditions or if you travel or work in places where you may be exposed to hepatitis B. Haemophilus influenzae type b (Hib) vaccine  You may need this if you have certain conditions. Human papillomavirus (HPV) vaccine  If recommended by your health care provider, you may need three doses over 6 months. You may receive vaccines as individual doses or as more than one vaccine together in one shot (combination vaccines). Talk with your health care provider about the risks and benefits  of combination vaccines. What tests do I need? Blood tests  Lipid and cholesterol levels. These may be checked every 5 years, or more frequently if you are over 70 years old.  Hepatitis C test.  Hepatitis B test. Screening  Lung cancer screening. You may have this screening every year starting at age 76 if you have a 30-pack-year history of smoking and currently smoke or have quit within the past 15 years.  Colorectal cancer screening. All adults should have this screening starting at age 79 and continuing until age 71. Your health care provider may  recommend screening at age 84 if you are at increased risk. You will have tests every 1-10 years, depending on your results and the type of screening test.  Diabetes screening. This is done by checking your blood sugar (glucose) after you have not eaten for a while (fasting). You may have this done every 1-3 years.  Mammogram. This may be done every 1-2 years. Talk with your health care provider about when you should start having regular mammograms. This may depend on whether you have a family history of breast cancer.  BRCA-related cancer screening. This may be done if you have a family history of breast, ovarian, tubal, or peritoneal cancers.  Pelvic exam and Pap test. This may be done every 3 years starting at age 66. Starting at age 35, this may be done every 5 years if you have a Pap test in combination with an HPV test. Other tests  Sexually transmitted disease (STD) testing.  Bone density scan. This is done to screen for osteoporosis. You may have this scan if you are at high risk for osteoporosis. Follow these instructions at home: Eating and drinking  Eat a diet that includes fresh fruits and vegetables, whole grains, lean protein, and low-fat dairy.  Take vitamin and mineral supplements as recommended by your health care provider.  Do not drink alcohol if: ? Your health care provider tells you not to drink. ? You are  pregnant, may be pregnant, or are planning to become pregnant.  If you drink alcohol: ? Limit how much you have to 0-1 drink a day. ? Be aware of how much alcohol is in your drink. In the [place] one drink equals one 12 oz bottle of beer (355 mL), one 5 oz glass of wine (148 mL), or one 1 oz glass of hard liquor (44 mL). Lifestyle  Take daily care of your teeth and gums.  Stay active. Exercise for at least 30 minutes on 5 or more days each week.  Do not use any products that contain nicotine or tobacco, such as cigarettes, e-cigarettes, and chewing tobacco. If you need help quitting, ask your health care provider.  If you are sexually active, practice safe sex. Use a condom or other form of birth control (contraception) in order to prevent pregnancy and STIs (sexually transmitted infections).  If told by your health care provider, take low-dose aspirin daily starting at age 70. What's next?  Visit your health care provider once a year for a well check visit.  Ask your health care provider how often you should have your eyes and teeth checked.  Stay up to date on all vaccines. This information is not intended to replace advice given to you by your health care provider. Make sure you discuss any questions you have with your health care provider. Document Revised: 12/23/2017 Document Reviewed: 12/23/2017 Elsevier Patient Education  2020 [employer].

## 2019-11-14 NOTE — Progress Notes (Signed)
GYNECOLOGY ANNUAL PREVENTATIVE CARE ENCOUNTER NOTE  History:     Nancy Silva is a 44 y.o. [id number] female here for a routine annual gynecologic exam and to establish care.  Current complaints: heavy periods with clots all her life but getting worse in last year. Wants to discuss intervention for this.  Denies abnormal vaginal  discharge, pelvic pain, problems with intercourse or other gynecologic concerns.    Gynecologic History Patient's last menstrual period was 10/29/2019. Contraception: none Last Pap: 2019. Results were: normal with negative HPV Last mammogram: 11/23/2016. Results were: normal  Obstetric History OB History  Gravida Para Term Preterm AB Living  3 2 2  0 1 2  SAB TAB Ectopic Multiple Live Births  1 0 0 0      # Outcome Date GA Lbr Len/2nd Weight Sex Delivery Anes PTL Lv  3 SAB           2 Term           1 Term             Past Medical History:  Diagnosis Date  . Hypertension   . Iron deficiency anemia 11/14/2012  . Menorrhagia with irregular cycle 11/06/2016    Past Surgical History:  Procedure Laterality Date  . CESAREAN SECTION    . TUBAL LIGATION  2011    Current Outpatient Medications on File Prior to Visit  Medication Sig Dispense Refill  . EPINEPHrine (EPI-PEN) 0.3 mg/0.3 mL SOAJ injection Inject 0.3 mLs (0.3 mg total) into the muscle once. 1 Device 3  . ferrous sulfate 325 (65 FE) MG tablet Take 1 tablet (325 mg total) by mouth every other day. 30 tablet 3  . lisinopril-hydrochlorothiazide (ZESTORETIC) 10-12.5 MG tablet TAKE 1 TABLET BY MOUTH EVERY DAY 30 tablet 2  . Multiple Vitamin (MULTIVITAMIN WITH MINERALS) TABS tablet Take 1 tablet by mouth daily.    . benzonatate (TESSALON) 100 MG capsule Take 2 capsules (200 mg total) by mouth 3 (three) times daily as needed for cough. (Patient not taking: Reported on 11/14/2019) 20 capsule 0  . ferumoxytol (FERAHEME) 510 MG/17ML SOLN injection Inject 17 mLs (510 mg total) into the vein once for 1  dose. 17 mL 0  . fluticasone (FLONASE) 50 MCG/ACT nasal spray SPRAY 2 SPRAYS INTO EACH NOSTRIL EVERY DAY (Patient not taking: Reported on 11/14/2019) 48 mL 1  . ibuprofen (ADVIL) 800 MG tablet Take 1 tablet (800 mg total) by mouth every 8 (eight) hours as needed. (Patient not taking: Reported on 11/14/2019) 30 tablet 0  . predniSONE (DELTASONE) 10 MG tablet Take 2 tablets (20 mg total) by mouth daily. (Patient not taking: Reported on 11/14/2019) 15 tablet 0   No current facility-administered medications on file prior to visit.    Allergies  Allergen Reactions  . Peanuts [Nuts]     Throat swelling  . Shellfish Allergy     Throat Swelling    Social History:  reports that she quit smoking about 12 years ago. Her smoking use included cigarettes. She has never used smokeless tobacco. She reports that she does not drink alcohol and does not use drugs.  Family History  Problem Relation Age of Onset  . Ovarian cancer Mother        Mother died in her 24s, shorlty the pt was born   . Breast cancer Cousin        diagnosed at age 90    The following portions of the patient's history were reviewed and  updated as appropriate: allergies, current medications, past family history, past medical history, past social history, past surgical history and problem list.  Review of Systems Pertinent items noted in HPI and remainder of comprehensive ROS otherwise negative.  Physical Exam:  BP (!) 149/90   Pulse 76   Wt 242 lb 9.6 oz (110 kg)   LMP 10/29/2019   BMI 38.00 kg/m  CONSTITUTIONAL: Well-developed, well-nourished female in no acute distress.  HENT:  Normocephalic, atraumatic, External right and left ear normal. Oropharynx is clear and moist EYES: Conjunctivae and EOM are normal. Pupils are equal, round, and reactive to light. No scleral icterus.  NECK: Normal range of motion, supple, no masses.  Normal thyroid.  SKIN: Skin is warm and dry. No rash noted. Not diaphoretic. No erythema. No  pallor. MUSCULOSKELETAL: Normal range of motion. No tenderness.  No cyanosis, clubbing, or edema.   NEUROLOGIC: Alert and oriented to person, place, and time. Normal reflexes, muscle tone coordination. No cranial nerve deficit noted. PSYCHIATRIC: Normal mood and affect. Normal behavior. Normal judgment and thought content. CARDIOVASCULAR: Normal heart rate noted, regular rhythm RESPIRATORY: Clear to auscultation bilaterally. Effort and breath sounds normal, no problems with respiration noted. BREASTS: Symmetric in size. No masses, tenderness, skin changes, nipple drainage, or lymphadenopathy bilaterally.  Performed in the presence of a chaperone. ABDOMEN: Soft, obese, no distention appreciated.  No tenderness, rebound or guarding.  PELVIC: Normal appearing external genitalia and urethral meatus; normal appearing vaginal mucosa and cervix.  Normal appearing discharge.  Pap smear obtained.  Unable to palpate uterus or adnexa secondary to habitus.  Performed in the presence of a chaperone.   Assessment and Plan:    1. Menorrhagia with regular cycle Discussed management options for abnormal uterine bleeding including NSAIDs (Naproxen), tranexamic acid (Lysteda), oral progesterone, Depo Provera, Levonogestrel IUD, endometrial ablation or hysterectomy as definitive surgical management.  Discussed risks and benefits of each method.   Patient desires nonhormonal medication trial.  Printed patient education handouts were given to the patient to review at home.  Lysteda and Naproxen prescribed as needed for now,  bleeding precautions reviewed.  - TSH - US PELVIC COMPLETE WITH TRANSVAGINAL; Future - tranexamic acid (LYSTEDA) 650 MG TABS tablet; Take 2 tablets (1,300 mg total) by mouth 3 (three) times daily. Take during menses for a maximum of five days  Dispense: 30 tablet; Refill: 2 - naproxen (NAPROSYN) 500 MG tablet; Take 1 tablet (500 mg total) by mouth 2 (two) times daily with a meal. As needed for pain or  during heavy menstrual bleeding  Dispense: 30 tablet; Refill: 2  2. FHx: breast cancer in first degree relative 3. FH: malignant neoplasm of ovary Interested in Environmental health practitioner (genetic testing). This will be done today.   4. Well woman exam with routine gynecological exam - HIV Antibody (routine testing w rflx) - Hep C Antibody - RPR - Cytology - PAP( [facility]) - Hepatitis B surface antigen Will follow up results of pap smear and infection screen and manage accordingly. Routine preventative health maintenance measures emphasized. Please refer to After Visit Summary for other counseling recommendations.      Verita Schneiders, MD, [place] for [employer], [place]

## 2019-11-15 LAB — HEPATITIS B SURFACE ANTIGEN: Hepatitis B Surface Ag: NEGATIVE

## 2019-11-15 LAB — TSH: TSH: 2.73 u[IU]/mL (ref 0.450–4.500)

## 2019-11-15 LAB — RPR: RPR Ser Ql: NONREACTIVE

## 2019-11-15 LAB — HEPATITIS C ANTIBODY: Hep C Virus Ab: <0.1 s/co ratio (ref 0.0–0.9)

## 2019-11-15 LAB — HIV ANTIBODY (ROUTINE TESTING W REFLEX): HIV Screen 4th Generation wRfx: NONREACTIVE

## 2019-11-16 ENCOUNTER — Other Ambulatory Visit: Payer: Self-pay | Admitting: Internal Medicine

## 2019-11-16 DIAGNOSIS — Z1231 Encounter for screening mammogram for malignant neoplasm of breast: Secondary | ICD-10-CM

## 2019-11-17 ENCOUNTER — Ambulatory Visit (INDEPENDENT_AMBULATORY_CARE_PROVIDER_SITE_OTHER): Payer: 59 | Admitting: Internal Medicine

## 2019-11-17 ENCOUNTER — Encounter: Payer: Self-pay | Admitting: Internal Medicine

## 2019-11-17 DIAGNOSIS — I10 Essential (primary) hypertension: Secondary | ICD-10-CM

## 2019-11-17 DIAGNOSIS — D500 Iron deficiency anemia secondary to blood loss (chronic): Secondary | ICD-10-CM

## 2019-11-17 DIAGNOSIS — M25551 Pain in right hip: Secondary | ICD-10-CM

## 2019-11-17 DIAGNOSIS — M25552 Pain in left hip: Secondary | ICD-10-CM

## 2019-11-17 MED ORDER — FERUMOXYTOL INJECTION 510 MG/17 ML: 510.0000 mg | Freq: Once | INTRAVENOUS | 0 refills | Status: DC

## 2019-11-17 NOTE — Assessment & Plan Note (Signed)
Nancy Silva is a 44 yo F w/ PMH of IDA, HTN presenting to [facility] for f/u visit for her iron deficiency anemia. She mentions that since her second Feraheme infusion, she has been feeling very well with complete resolution of her fatigue and weakness. She mentions that she also has had improvement in her hip pains and overall feels well. She has f/u appt with ob/gyn to address her menorrhagia.   - F/u with ob/gyn  - C/w oral iron - Check cbc at next visit

## 2019-11-17 NOTE — Progress Notes (Signed)
   CC: Anemia  HPI: [name] is a 44 y.o. with PMH listed below presenting with complaint of anemia. Please see problem based assessment and plan for further details.  Past Medical History:  Diagnosis Date  . Hypertension   . Iron deficiency anemia 11/14/2012  . Menorrhagia with irregular cycle 11/06/2016    Review of Systems: Review of Systems  Constitutional: Negative for chills, fever and malaise/fatigue.  Eyes: Negative for blurred vision.  Respiratory: Negative for cough and shortness of breath.   Cardiovascular: Negative for chest pain, palpitations and leg swelling.  Gastrointestinal: Negative for constipation, diarrhea, nausea and vomiting.  Musculoskeletal: Positive for neck pain. Negative for back pain and joint pain.  Neurological: Negative for dizziness and headaches.  All other systems reviewed and are negative.    Physical Exam: Vitals:   11/17/19 1420 11/17/19 1458  BP: (!) 143/71 (!) 130/74  Pulse: 86   Temp: 98.2 F (36.8 C)   TempSrc: Oral   SpO2: 98%   Weight: (!) 246 lb 8 oz (111.8 kg)   Height: 5\' 7"  (1.702 m)    Gen: Well-developed, well nourished, NAD HEENT: NCAT head, hearing intact CV: RRR, S1, S2 normal Pulm: CTAB, No rales, no wheezes Extm: ROM intact, Peripheral pulses intact, No peripheral edema, no hip tenderness Skin: Dry, Warm, normal turgor  Assessment & Plan:   Iron deficiency anemia Nancy Silva is a 44 yo F w/ PMH of IDA, HTN presenting to [facility] for f/u visit for her iron deficiency anemia. She mentions that since her second Feraheme infusion, she has been feeling very well with complete resolution of her fatigue and weakness. She mentions that she also has had improvement in her hip pains and overall feels well. She has f/u appt with ob/gyn to address her menorrhagia.   - F/u with ob/gyn  - C/w oral iron - Check cbc at next visit  Essential hypertension BP Readings from Last 3 Encounters:  11/17/19 (!) 130/74    11/14/19 (!) 149/90  11/10/19 112/71   Presents for f/u visit. Currently on lisinopril-hctz. Did not take her bp meds this morning. Above goal but just barely. Denies any significant side effects.  - C/w lisinopril-hctz 20-25mg  daily  Acute hip pain, bilateral Mentions complete resolution of her pain. ROM, strength all intact  - Resolved    Patient discussed with Dr. Jimmye Norman  -Gilberto Better, [facility] Pager: [phone]

## 2019-11-17 NOTE — Assessment & Plan Note (Addendum)
Mentions complete resolution of her pain. ROM, strength all intact  - Resolved

## 2019-11-17 NOTE — Assessment & Plan Note (Signed)
BP Readings from Last 3 Encounters:  11/17/19 (!) 130/74  11/14/19 (!) 149/90  11/10/19 112/71   Presents for f/u visit. Currently on lisinopril-hctz. Did not take her bp meds this morning. Above goal but just barely. Denies any significant side effects.  - C/w lisinopril-hctz 20-25mg  daily

## 2019-11-17 NOTE — Patient Instructions (Addendum)
Thank you for allowing [place] to provide your care today. Today we discussed your blood work    I have ordered no labs for you. I will call if any are abnormal.    Today we made no changes to your medications.    Please follow-up in 3 months.    Should you have any questions or concerns please call the internal medicine clinic at [phone].     Iron Deficiency Anemia, Adult Iron-deficiency anemia is when you have a low amount of red blood cells or hemoglobin. This happens because you have too little iron in your body. Hemoglobin carries oxygen to parts of the body. Anemia can cause your body to not get enough oxygen. It may or may not cause symptoms. Follow these instructions at home: Medicines  Take over-the-counter and prescription medicines only as told by your doctor. This includes iron pills (supplements) and vitamins.  If you cannot handle taking iron pills by mouth, ask your doctor about getting iron through: ? A vein (intravenously). ? A shot (injection) into a muscle.  Take iron pills when your stomach is empty. If you cannot handle this, take them with food.  Do not drink milk or take antacids at the same time as your iron pills.  To prevent trouble pooping (constipation), eat fiber or take medicine (stool softener) as told by your doctor. Eating and drinking   Talk with your doctor before changing the foods you eat. He or she may tell you to eat foods that have a lot of iron, such as: ? Liver. ? Lowfat (lean) beef. ? Breads and cereals that have iron added to them (fortified breads and cereals). ? Eggs. ? Dried fruit. ? Dark green, leafy vegetables.  Drink enough fluid to keep your pee (urine) clear or pale yellow.  Eat fresh fruits and vegetables that are high in vitamin C. They help your body to use iron. Foods with a lot of vitamin C include: ? Oranges. ? Peppers. ? Tomatoes. ? Mangoes. General instructions  Return to your normal activities as told by your  doctor. Ask your doctor what activities are safe for you.  Keep yourself clean, and keep things clean around you (your surroundings). Anemia can make you get sick more easily.  Keep all follow-up visits as told by your doctor. This is important. Contact a doctor if:  You feel sick to your stomach (nauseous).  You throw up (vomit).  You feel weak.  You are sweating for no clear reason.  You have trouble pooping, such as: ? Pooping (having a bowel movement) less than 3 times a week. ? Straining to poop. ? Having poop that is hard, dry, or larger than normal. ? Feeling full or bloated. ? Pain in the lower belly. ? Not feeling better after pooping. Get help right away if:  You pass out (faint). If this happens, do not drive yourself to the hospital. Call your local emergency services (911 in the [place]  You have chest pain.  You have shortness of breath that: ? Is very bad. ? Gets worse with physical activity.  You have a fast heartbeat.  You get light-headed when getting up from sitting or lying down. This information is not intended to replace advice given to you by your health care provider. Make sure you discuss any questions you have with your health care provider. Document Revised: 03/26/2017 Document Reviewed: 01/01/2016 Elsevier Patient Education  [place].

## 2019-11-20 ENCOUNTER — Other Ambulatory Visit: Payer: Self-pay | Admitting: Internal Medicine

## 2019-11-20 ENCOUNTER — Other Ambulatory Visit: Payer: Self-pay | Admitting: Obstetrics & Gynecology

## 2019-11-20 DIAGNOSIS — Z1231 Encounter for screening mammogram for malignant neoplasm of breast: Secondary | ICD-10-CM

## 2019-11-20 LAB — CYTOLOGY - PAP
Chlamydia: NEGATIVE
Comment: NEGATIVE
Comment: NEGATIVE
Comment: NEGATIVE
Comment: NEGATIVE
Comment: NORMAL
Diagnosis: NEGATIVE
HSV1: NEGATIVE
HSV2: NEGATIVE
High risk HPV: NEGATIVE
Neisseria Gonorrhea: NEGATIVE
Trichomonas: NEGATIVE

## 2019-11-20 NOTE — Progress Notes (Signed)
Internal Medicine Clinic Attending  Case discussed with Dr. Lee  At the time of the visit.  We reviewed the resident's history and exam and pertinent patient test results.  I agree with the assessment, diagnosis, and plan of care documented in the resident's note.    

## 2019-11-21 ENCOUNTER — Ambulatory Visit
Admission: RE | Admit: 2019-11-21 | Discharge: 2019-11-21 | Disposition: A | Discharge status: Home / Self Care | Payer: 59 | Source: Ambulatory Visit | Attending: Obstetrics & Gynecology | Admitting: Obstetrics & Gynecology

## 2019-11-21 ENCOUNTER — Other Ambulatory Visit: Payer: Self-pay

## 2019-11-21 DIAGNOSIS — N920 Excessive and frequent menstruation with regular cycle: Secondary | ICD-10-CM | POA: Insufficient documentation

## 2019-11-24 ENCOUNTER — Other Ambulatory Visit: Payer: Self-pay

## 2019-11-24 ENCOUNTER — Ambulatory Visit
Admission: RE | Admit: 2019-11-24 | Discharge: 2019-11-24 | Disposition: A | Discharge status: Home / Self Care | Payer: 59 | Source: Ambulatory Visit

## 2019-11-24 DIAGNOSIS — Z1231 Encounter for screening mammogram for malignant neoplasm of breast: Secondary | ICD-10-CM

## 2019-11-27 ENCOUNTER — Encounter: Payer: Self-pay | Admitting: Radiology

## 2019-11-29 ENCOUNTER — Other Ambulatory Visit: Payer: Self-pay | Admitting: *Deleted

## 2019-11-29 DIAGNOSIS — D500 Iron deficiency anemia secondary to blood loss (chronic): Secondary | ICD-10-CM

## 2019-11-29 MED ORDER — FERROUS SULFATE 325 (65 FE) MG PO TABS: 325.0000 mg | ORAL_TABLET | ORAL | 1 refills | Status: DC

## 2019-12-05 ENCOUNTER — Encounter: Payer: 59 | Admitting: Obstetrics & Gynecology

## 2019-12-27 ENCOUNTER — Ambulatory Visit (HOSPITAL_BASED_OUTPATIENT_CLINIC_OR_DEPARTMENT_OTHER): Admit: 2019-12-27 | Payer: 59 | Admitting: Obstetrics & Gynecology

## 2019-12-27 ENCOUNTER — Encounter (HOSPITAL_BASED_OUTPATIENT_CLINIC_OR_DEPARTMENT_OTHER): Payer: Self-pay

## 2019-12-27 SURGERY — DILATATION & CURETTAGE/HYSTEROSCOPY WITH HYDROTHERMAL ABLATION
Anesthesia: Choice

## 2020-01-24 ENCOUNTER — Other Ambulatory Visit: Payer: Self-pay | Admitting: Internal Medicine

## 2020-01-24 DIAGNOSIS — I10 Essential (primary) hypertension: Secondary | ICD-10-CM

## 2020-05-07 ENCOUNTER — Telehealth: Payer: 59 | Admitting: Nurse Practitioner

## 2020-05-07 DIAGNOSIS — R059 Cough, unspecified: Secondary | ICD-10-CM | POA: Diagnosis not present

## 2020-05-07 MED ORDER — DOXYCYCLINE HYCLATE 100 MG PO TABS
100.0000 mg | ORAL_TABLET | Freq: Two times a day (BID) | ORAL | 0 refills | Status: DC
Start: 2020-05-07 — End: 2020-12-05

## 2020-05-07 NOTE — Progress Notes (Signed)
We are sorry that you are not feeling well.  Here is how we plan to help!  Based on your presentation I believe you most likely have A cough due to bacteria.  When patients have a fever and a productive cough with a change in color or increased sputum production, we are concerned about bacterial bronchitis.  If left untreated it can progress to pneumonia.  If your symptoms do not improve with your treatment plan it is important that you contact your provider.   I have prescribed Doxycycline 100 mg twice a day for 7 days     In addition you may use A non-prescription cough medication called Mucinex DM: take 2 tablets every 12 hours.   From your responses in the eVisit questionnaire you describe inflammation in the upper respiratory tract which is causing a significant cough.  This is commonly called Bronchitis and has four common causes:    Allergies  Viral Infections  Acid Reflux  Bacterial Infection Allergies, viruses and acid reflux are treated by controlling symptoms or eliminating the cause. An example might be a cough caused by taking certain blood pressure medications. You stop the cough by changing the medication. Another example might be a cough caused by acid reflux. Controlling the reflux helps control the cough.  USE OF BRONCHODILATOR ("RESCUE") INHALERS: There is a risk from using your bronchodilator too frequently.  The risk is that over-reliance on a medication which only relaxes the muscles surrounding the breathing tubes can reduce the effectiveness of medications prescribed to reduce swelling and congestion of the tubes themselves.  Although you feel brief relief from the bronchodilator inhaler, your asthma may actually be worsening with the tubes becoming more swollen and filled with mucus.  This can delay other crucial treatments, such as oral steroid medications. If you need to use a bronchodilator inhaler daily, several times per day, you should discuss this with your  provider.  There are probably better treatments that could be used to keep your asthma under control.     HOME CARE . Only take medications as instructed by your medical team. . Complete the entire course of an antibiotic. . Drink plenty of fluids and get plenty of rest. . Avoid close contacts especially the very young and the elderly . Cover your mouth if you cough or cough into your sleeve. . Always remember to wash your hands . A steam or ultrasonic humidifier can help congestion.   GET HELP RIGHT AWAY IF: . You develop worsening fever. . You become short of breath . You cough up blood. . Your symptoms persist after you have completed your treatment plan MAKE SURE YOU   Understand these instructions.  Will watch your condition.  Will get help right away if you are not doing well or get worse.  Your e-visit answers were reviewed by a board certified advanced clinical practitioner to complete your personal care plan.  Depending on the condition, your plan could have included both over the counter or prescription medications. If there is a problem please reply  once you have received a response from your provider. Your safety is important to us.  If you have drug allergies check your prescription carefully.    You can use MyChart to ask questions about today's visit, request a non-urgent call back, or ask for a work or school excuse for 24 hours related to this e-Visit. If it has been greater than 24 hours you will need to follow up with your provider,   or enter a new e-Visit to address those concerns. You will get an e-mail in the next two days asking about your experience.  I hope that your e-visit has been valuable and will speed your recovery. Thank you for using e-visits.  5-10 minutes spent reviewing and documenting in chart.   

## 2020-09-18 ENCOUNTER — Encounter: Payer: Self-pay | Admitting: Radiology

## 2020-10-29 ENCOUNTER — Encounter: Payer: Self-pay | Admitting: *Deleted

## 2020-12-03 ENCOUNTER — Encounter: Payer: Self-pay | Admitting: Student

## 2020-12-05 ENCOUNTER — Other Ambulatory Visit (HOSPITAL_COMMUNITY): Payer: Self-pay

## 2020-12-05 ENCOUNTER — Ambulatory Visit (INDEPENDENT_AMBULATORY_CARE_PROVIDER_SITE_OTHER): Payer: Self-pay | Admitting: Student

## 2020-12-05 ENCOUNTER — Other Ambulatory Visit: Payer: Self-pay

## 2020-12-05 ENCOUNTER — Encounter: Payer: Self-pay | Admitting: Student

## 2020-12-05 VITALS — BP 146/80 | HR 75 | Temp 98.4°F | Ht 67.0 in | Wt 242.4 lb

## 2020-12-05 DIAGNOSIS — R7303 Prediabetes: Secondary | ICD-10-CM

## 2020-12-05 DIAGNOSIS — M19012 Primary osteoarthritis, left shoulder: Secondary | ICD-10-CM | POA: Insufficient documentation

## 2020-12-05 DIAGNOSIS — I10 Essential (primary) hypertension: Secondary | ICD-10-CM

## 2020-12-05 DIAGNOSIS — D500 Iron deficiency anemia secondary to blood loss (chronic): Secondary | ICD-10-CM

## 2020-12-05 LAB — POCT GLYCOSYLATED HEMOGLOBIN (HGB A1C): Hemoglobin A1C: 6 % — AB (ref 4.0–5.6)

## 2020-12-05 LAB — GLUCOSE, CAPILLARY: Glucose-Capillary: 111 mg/dL — ABNORMAL HIGH (ref 70–99)

## 2020-12-05 MED ORDER — LISINOPRIL-HYDROCHLOROTHIAZIDE 10-12.5 MG PO TABS
1.0000 | ORAL_TABLET | Freq: Every day | ORAL | 2 refills | Status: DC
  Filled 2020-12-05: qty 30, 30d supply, fill #0
  Filled 2021-02-05 – 2021-02-17 (×2): qty 30, 30d supply, fill #1
  Filled 2021-05-05: qty 30, 30d supply, fill #2

## 2020-12-05 NOTE — Assessment & Plan Note (Addendum)
Patient with history of iron deficiency is anemia secondary to menorrhagia.  Patient was seen by OB/GYN in the past and was recommended ablation therapy.  Patient however could not afford the procedure without any insurance coverage.  Last pelvic ultrasound only showed prominent uterus without any fibroid, mass or adnexal masses.  She reports heavy bleeding every 2 weeks.  She report feeling fatigue for a long time.  She is currently taking ferrous sulfate 325 mg prescribed by the last provider.  She only has a few tablets left.  She also taking over-the-counter iron supplement, said the dose is 60 mg.  Assessment and plan Last hemoglobin was 10 with ferritin 11.  She has received IV Feraheme in the past.  Will recheck CBC and ferritin, and decide on the type of iron supplement.  -CBC and ferritin -Continue ferrous sulfate 325 mg for now -Applying for orange car and work Insurance underwriter for procedure.  Addendum Hemoglobin came back 8.5.  Ferritin was 9. -Will send a prescription of polysaccharide iron 150 mg every other day.  Stop ferrous sulfate.  She will let us know if medication is not under the $4 list. -Continue working on applying for orange color and work Insurance underwriter for OB/GYN procedure -Repeat blood work next visit

## 2020-12-05 NOTE — Patient Instructions (Signed)
Nancy Silva,  It was pleasure seeing you in the clinic today.  Here is a summary of what we talked about:  1.  High blood pressure: Your blood pressure is elevated today.  I will send the prescription of your medication to the [facility] outpatient pharmacy.  2.  Iron deficiency anemia: We will check a hemoglobin and iron today.  We will decide the type of supplement based on your lab result.  Please work on applying for the orange car and getting insurance from the company for the ablation procedure.  3.  Prediabetes: Your A1c is 6.  Please work on lifestyle modification and exercise.  Cut back on foods to high in starch such as rice, pasta, bread, potato, corn.  Also cut back on foods high in sugar.   4.  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  Please continue using Tylenol, Voltaren gel as needed.  You can also use ice or heat.  Please rest your shoulder when you have a flare.  Please return in 4 weeks for repeat blood work and pressure recheck.  Take care,  Dr. Alfonse Spruce

## 2020-12-05 NOTE — Assessment & Plan Note (Addendum)
Patient report left shoulder pain that has been going for 1 year.  States that she fell in her bathroom 1 year ago and was evaluated in the ED.  Said that her left shoulder x-ray was negative for fracture.  States that pain comes and goes and worsened recently.  She is working at [employer] denies heavy lifting.  She denies any change in activity recently.  She is getting Tylenol arthritis (2-3 tablets a day) and Voltaren gel with significant relief.  She also use heat.  Assessment and plan No record of left shoulder x-ray found in chart review.  Patient has full range of motion on physical exam.  No neurological deficit.  There are some tenderness to palpation of the [facility] joint.  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  No red flag symptoms found on exam.  Continue Tylenol, Voltaren gel as needed.  She can also use ice and heat to help.  Advised patient to rest her shoulder when she has a flare.  She will let us know if there is any hematoma, bruise or worsening of her left shoulder pain.

## 2020-12-05 NOTE — Assessment & Plan Note (Signed)
Initial blood pressure 153/82.  Repeat 146/80.  Patient has been out of medication for last 4 months.  -Will refill lisinopril-HCTZ 10-12.5 mg.  Prescription sent to [facility] outpatient pharmacy. -Return in 4 weeks for BMP and blood pressure recheck

## 2020-12-05 NOTE — Progress Notes (Signed)
   CC: Medication refill.  Chronic left shoulder pain  HPI:  [name] is a 45 y.o. with past medical history of hypertension, iron deficiency anemia, who presented to the clinic for medication refill and evaluation for chronic left shoulder pain.  Please see problem based charting for details  Past Medical History:  Diagnosis Date   Hypertension    Iron deficiency anemia 11/14/2012   Menorrhagia with irregular cycle 11/06/2016   Review of Systems:    Per HPI  Physical Exam:  Vitals:   12/05/20 0843  BP: (!) 153/82  Pulse: 79  Temp: 98.4 F (36.9 C)  TempSrc: Oral  SpO2: 100%  Weight: 242 lb 6.4 oz (110 kg)  Height: '5\' 7"'$  (1.702 m)   Physical Exam Constitutional:      General: She is not in acute distress.    Appearance: She is not toxic-appearing.  HENT:     Head: Normocephalic.  Eyes:     Conjunctiva/sclera: Conjunctivae normal.  Cardiovascular:     Rate and Rhythm: Normal rate and regular rhythm.     Heart sounds: Normal heart sounds.  Pulmonary:     Effort: Pulmonary effort is normal. No respiratory distress.     Breath sounds: Normal breath sounds. No wheezing.  Musculoskeletal:     Comments: Patient has full range of motion of left shoulder.  5/5 strength. Pain to palpation of the [facility] joint. Normal range of motion of left elbow joint   Skin:    General: Skin is warm.     Coloration: Skin is not jaundiced.  Neurological:     Mental Status: She is alert and oriented to person, place, and time.  Psychiatric:        Behavior: Behavior normal.     Assessment & Plan:   See Encounters Tab for problem based charting.  Patient discussed with Dr. Philipp Ovens

## 2020-12-05 NOTE — Assessment & Plan Note (Signed)
Last A1c 6.3 in 2018.  Today is 6.  I offer to start metformin vs lifestyle modification.  Patient decided to change her diet and cut back on starchy and sugary food.

## 2020-12-06 ENCOUNTER — Other Ambulatory Visit (HOSPITAL_COMMUNITY): Payer: Self-pay

## 2020-12-06 LAB — CBC
Hematocrit: 29.3 % — ABNORMAL LOW (ref 34.0–46.6)
Hemoglobin: 8.5 g/dL — ABNORMAL LOW (ref 11.1–15.9)
MCH: 21 pg — ABNORMAL LOW (ref 26.6–33.0)
MCHC: 29 g/dL — ABNORMAL LOW (ref 31.5–35.7)
MCV: 72 fL — ABNORMAL LOW (ref 79–97)
Platelets: 380 10*3/uL (ref 150–450)
RBC: 4.05 x10E6/uL (ref 3.77–5.28)
RDW: 16.6 % — ABNORMAL HIGH (ref 11.7–15.4)
WBC: 7.5 10*3/uL (ref 3.4–10.8)

## 2020-12-06 LAB — FERRITIN: Ferritin: 9 ng/mL — ABNORMAL LOW (ref 15–150)

## 2020-12-06 MED ORDER — POLYSACCHARIDE IRON COMPLEX 150 MG PO CAPS
150.0000 mg | ORAL_CAPSULE | Freq: Every day | ORAL | 2 refills | Status: DC
  Filled 2020-12-06: qty 30, 30d supply, fill #0
  Filled 2021-02-05 – 2021-02-17 (×2): qty 30, 30d supply, fill #1
  Filled 2021-05-05: qty 30, 30d supply, fill #2

## 2020-12-06 NOTE — Addendum Note (Signed)
Addended byGaylan Gerold on: 12/06/2020 12:02 PM   Modules accepted: Orders

## 2020-12-10 NOTE — Progress Notes (Signed)
Internal Medicine Clinic Attending  Case discussed with Dr. Nguyen  At the time of the visit.  We reviewed the resident's history and exam and pertinent patient test results.  I agree with the assessment, diagnosis, and plan of care documented in the resident's note. 

## 2021-01-02 ENCOUNTER — Encounter: Payer: Self-pay | Admitting: Internal Medicine

## 2021-02-06 ENCOUNTER — Other Ambulatory Visit (HOSPITAL_COMMUNITY): Payer: Self-pay

## 2021-02-14 ENCOUNTER — Other Ambulatory Visit (HOSPITAL_COMMUNITY): Payer: Self-pay

## 2021-02-17 ENCOUNTER — Other Ambulatory Visit (HOSPITAL_COMMUNITY): Payer: Self-pay

## 2021-03-11 ENCOUNTER — Ambulatory Visit
Admission: EM | Admit: 2021-03-11 | Discharge: 2021-03-11 | Disposition: A | Discharge status: Home / Self Care | Payer: 59

## 2021-03-11 ENCOUNTER — Ambulatory Visit (INDEPENDENT_AMBULATORY_CARE_PROVIDER_SITE_OTHER): Payer: 59

## 2021-03-11 DIAGNOSIS — M545 Low back pain, unspecified: Secondary | ICD-10-CM

## 2021-03-11 DIAGNOSIS — M542 Cervicalgia: Secondary | ICD-10-CM

## 2021-03-11 DIAGNOSIS — N920 Excessive and frequent menstruation with regular cycle: Secondary | ICD-10-CM

## 2021-03-11 MED ORDER — TIZANIDINE HCL 4 MG PO CAPS: 4.0000 mg | ORAL_CAPSULE | Freq: Three times a day (TID) | ORAL | 0 refills | Status: DC

## 2021-03-11 MED ORDER — NAPROXEN 500 MG PO TABS: 500.0000 mg | ORAL_TABLET | Freq: Two times a day (BID) | ORAL | 0 refills | Status: DC
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
## 2021-03-11 NOTE — Discharge Instructions (Signed)
Your x-ray showed some arthritis in your neck but no acute fracture.  Your lower back x-ray was normal.  Please start Naprosyn twice daily.  Do not take additional NSAIDs including aspirin, ibuprofen/Advil, naproxen/Aleve with this medication as it can cause stomach bleeding.  You can use Tylenol for breakthrough pain.  Take Zanaflex 3 times a day as needed.  This can make you sleepy so do not drive or drink alcohol taking this.  Use heat, rest, stretch for additional symptom relief.  If symptoms or not improving follow-up with sports medicine as we discussed.

## 2021-03-26 ENCOUNTER — Other Ambulatory Visit: Payer: Self-pay | Admitting: Registered Nurse

## 2021-03-26 DIAGNOSIS — Z1231 Encounter for screening mammogram for malignant neoplasm of breast: Secondary | ICD-10-CM

## 2021-05-05 ENCOUNTER — Other Ambulatory Visit (HOSPITAL_COMMUNITY): Payer: Self-pay

## 2021-05-06 ENCOUNTER — Other Ambulatory Visit: Payer: Self-pay | Admitting: Student

## 2021-05-06 ENCOUNTER — Ambulatory Visit: Payer: 59 | Admitting: Obstetrics & Gynecology

## 2021-05-06 ENCOUNTER — Ambulatory Visit
Admission: RE | Admit: 2021-05-06 | Discharge: 2021-05-06 | Disposition: A | Discharge status: Home / Self Care | Payer: 59 | Source: Ambulatory Visit

## 2021-05-06 ENCOUNTER — Other Ambulatory Visit (HOSPITAL_COMMUNITY): Payer: Self-pay

## 2021-05-06 DIAGNOSIS — Z1231 Encounter for screening mammogram for malignant neoplasm of breast: Secondary | ICD-10-CM

## 2021-05-09 ENCOUNTER — Other Ambulatory Visit (HOSPITAL_COMMUNITY): Payer: Self-pay

## 2021-05-21 ENCOUNTER — Telehealth: Payer: 59 | Admitting: Family

## 2021-05-21 DIAGNOSIS — R6889 Other general symptoms and signs: Secondary | ICD-10-CM | POA: Diagnosis not present

## 2021-05-21 MED ORDER — OSELTAMIVIR PHOSPHATE 75 MG PO CAPS: 75.0000 mg | ORAL_CAPSULE | Freq: Two times a day (BID) | ORAL | 0 refills | Status: DC

## 2021-05-21 MED ORDER — BENZONATATE 100 MG PO CAPS: 100.0000 mg | ORAL_CAPSULE | Freq: Three times a day (TID) | ORAL | 0 refills | Status: DC | PRN

## 2021-05-21 NOTE — Progress Notes (Signed)
Virtual Visit Consent   Nancy Silva, you are scheduled for a virtual visit with a [facility] provider today.     Just as with appointments in the office, your consent must be obtained to participate.  Your consent will be active for this visit and any virtual visit you may have with one of our providers in the next 365 days.     If you have a MyChart account, a copy of this consent can be sent to you electronically.  All virtual visits are billed to your insurance company just like a traditional visit in the office.    As this is a virtual visit, video technology does not allow for your provider to perform a traditional examination.  This may limit your provider's ability to fully assess your condition.  If your provider identifies any concerns that need to be evaluated in person or the need to arrange testing (such as labs, EKG, etc.), we will make arrangements to do so.     Although advances in technology are sophisticated, we cannot ensure that it will always work on either your end or our end.  If the connection with a video visit is poor, the visit may have to be switched to a telephone visit.  With either a video or telephone visit, we are not always able to ensure that we have a secure connection.     I need to obtain your verbal consent now.   Are you willing to proceed with your visit today?    Jodell Weitman has provided verbal consent on 05/21/2021 for a virtual visit (video or telephone).   Evelina Dun, FNP   Date: 05/21/2021 11:16 AM   Virtual Visit via Video Note   I, Evelina Dun, connected with  Nancy Silva  ([id number], May 09, 1975) on 05/21/21 at 11:15 AM EST by a video-enabled telemedicine application and verified that I am speaking with the correct person using two identifiers.  Location: Patient: Virtual Visit Location Patient: Home Provider: Virtual Visit Location Provider: Home Office   I discussed the limitations of evaluation and management by  telemedicine and the availability of in person appointments. The patient expressed understanding and agreed to proceed.    History of Present Illness: Elfida Shimada is a 46 y.o. who identifies as a female who was assigned female at birth, and is being seen today for headache, sore throat, and chills that started yesterday. She has not taken a COVID test, but states she had COVID last month.   HPI: URI  This is a new problem. The current episode started yesterday. The problem has been gradually worsening. There has been no fever. Associated symptoms include congestion, coughing, headaches, rhinorrhea, sinus pain, sneezing and a sore throat. Pertinent negatives include no diarrhea, dysuria or ear pain. She has tried acetaminophen for the symptoms. The treatment provided mild relief.   Problems:  Patient Active Problem List   Diagnosis Date Noted   Arthritis of left acromioclavicular joint 12/05/2020   Prediabetes 12/05/2020   Menorrhagia with irregular cycle 11/06/2016   Multiple food allergies 11/28/2012   Iron deficiency anemia 11/14/2012   Uterine fibroid 11/14/2012   Obesity, unspecified 11/14/2012   Seasonal allergies 12/15/2010   Essential hypertension 03/01/2006    Allergies:  Allergies  Allergen Reactions   Peanuts [Nuts]     Throat swelling   Shellfish Allergy     Throat Swelling   Medications:  Current Outpatient Medications:    benzonatate (TESSALON PERLES) 100 MG capsule, Take 1 capsule (  100 mg total) by mouth 3 (three) times daily as needed., Disp: 20 capsule, Rfl: 0   oseltamivir (TAMIFLU) 75 MG capsule, Take 1 capsule (75 mg total) by mouth 2 (two) times daily., Disp: 10 capsule, Rfl: 0   EPINEPHrine (EPI-PEN) 0.3 mg/0.3 mL SOAJ injection, Inject 0.3 mLs (0.3 mg total) into the muscle once., Disp: 1 Device, Rfl: 3   ferrous sulfate 325 (65 FE) MG tablet, Take 1 tablet by mouth every other day., Disp: , Rfl:    iron polysaccharides (NIFEREX) 150 MG capsule, Take 1  capsule (150 mg total) by mouth daily., Disp: 30 capsule, Rfl: 2   lisinopril-hydrochlorothiazide (ZESTORETIC) 10-12.5 MG tablet, Take 1 tablet by mouth daily., Disp: 30 tablet, Rfl: 2   Multiple Vitamin (MULTIVITAMIN WITH MINERALS) TABS tablet, Take 1 tablet by mouth daily., Disp: , Rfl:    naproxen (NAPROSYN) 500 MG tablet, Take 1 tablet (500 mg total) by mouth 2 (two) times daily with a meal. As needed for pain or during heavy menstrual bleeding, Disp: 30 tablet, Rfl: 0   tiZANidine (ZANAFLEX) 4 MG capsule, Take 1 capsule (4 mg total) by mouth 3 (three) times daily., Disp: 30 capsule, Rfl: 0  Observations/Objective: Patient is well-developed, well-nourished in no acute distress.  Resting comfortably  at home.  Head is normocephalic, atraumatic.  No labored breathing.  Speech is clear and coherent with logical content.  Patient is alert and oriented at baseline.    Assessment and Plan: 1. Flu-like symptoms - benzonatate (TESSALON PERLES) 100 MG capsule; Take 1 capsule (100 mg total) by mouth 3 (three) times daily as needed.  Dispense: 20 capsule; Refill: 0  Force fluids Rest Tylenol  Start Tamiflu Work note given Follow up if symptoms worsen or do not improve  Follow Up Instructions: I discussed the assessment and treatment plan with the patient. The patient was provided an opportunity to ask questions and all were answered. The patient agreed with the plan and demonstrated an understanding of the instructions.  A copy of instructions were sent to the patient via MyChart unless otherwise noted below.     The patient was advised to call back or seek an in-person evaluation if the symptoms worsen or if the condition fails to improve as anticipated.  Time:  I spent 9 minutes with the patient via telehealth technology discussing the above problems/concerns.    Evelina Dun, FNP

## 2021-05-24 ENCOUNTER — Other Ambulatory Visit: Payer: Self-pay | Admitting: Family

## 2021-05-26 ENCOUNTER — Other Ambulatory Visit (HOSPITAL_COMMUNITY): Payer: Self-pay

## 2021-05-26 ENCOUNTER — Other Ambulatory Visit: Payer: Self-pay | Admitting: Family

## 2021-05-26 MED ORDER — AMOXICILLIN 500 MG PO CAPS
500.0000 mg | ORAL_CAPSULE | Freq: Two times a day (BID) | ORAL | 0 refills | Status: AC
  Filled 2021-05-26: qty 20, 10d supply, fill #0

## 2021-06-01 ENCOUNTER — Other Ambulatory Visit: Payer: Self-pay | Admitting: Student

## 2021-06-01 DIAGNOSIS — I10 Essential (primary) hypertension: Secondary | ICD-10-CM

## 2021-06-02 ENCOUNTER — Other Ambulatory Visit (HOSPITAL_COMMUNITY): Payer: Self-pay

## 2021-06-02 ENCOUNTER — Ambulatory Visit: Payer: 59 | Admitting: Obstetrics & Gynecology

## 2021-06-02 MED ORDER — LISINOPRIL-HYDROCHLOROTHIAZIDE 10-12.5 MG PO TABS
1.0000 | ORAL_TABLET | Freq: Every day | ORAL | 2 refills | Status: DC
  Filled 2021-06-02: qty 30, 30d supply, fill #0
  Filled 2021-08-04: qty 30, 30d supply, fill #1
  Filled 2021-09-01: qty 30, 30d supply, fill #2

## 2021-08-04 ENCOUNTER — Other Ambulatory Visit (HOSPITAL_COMMUNITY): Payer: Self-pay

## 2021-08-04 ENCOUNTER — Ambulatory Visit: Payer: 59

## 2021-08-05 ENCOUNTER — Ambulatory Visit
Admission: EM | Admit: 2021-08-05 | Discharge: 2021-08-05 | Disposition: A | Discharge status: Home / Self Care | Payer: 59 | Attending: Physician Assistant | Admitting: Physician Assistant

## 2021-08-05 ENCOUNTER — Other Ambulatory Visit (HOSPITAL_COMMUNITY): Payer: Self-pay

## 2021-08-05 DIAGNOSIS — M545 Low back pain, unspecified: Secondary | ICD-10-CM

## 2021-08-05 MED ORDER — CYCLOBENZAPRINE HCL 10 MG PO TABS
10.0000 mg | ORAL_TABLET | Freq: Two times a day (BID) | ORAL | 0 refills | Status: DC | PRN
  Filled 2021-08-05: qty 20, 10d supply, fill #0

## 2021-08-05 MED ORDER — PREDNISONE 20 MG PO TABS
40.0000 mg | ORAL_TABLET | Freq: Every day | ORAL | 0 refills | Status: AC
  Filled 2021-08-05: qty 10, 5d supply, fill #0

## 2021-08-05 NOTE — ED Triage Notes (Signed)
Onset 4 days of lower back pain. No known injuries to back. ?

## 2021-08-05 NOTE — ED Provider Notes (Signed)
?[facility] ? ? ? ?CSN: [id number] ?Arrival date & time: 08/05/21  1048 ? ? ?  ? ?History   ?Chief Complaint ?Chief Complaint  ?Patient presents with  ? Back Pain  ? ? ?HPI ?Nancy Silva is a 46 y.o. female.  ? ?Patient here today for evaluation of lower back pain that started 4 days ago. She denies any known injury. She does sit for work and notes that sitting seems to be an aggravating factor for current pain. She has not had any numbness or tingling. Pain is mostly located to right.  ? ?The history is provided by the patient.  ? ?Past Medical History:  ?Diagnosis Date  ? Hypertension   ? Iron deficiency anemia 11/14/2012  ? Menorrhagia with irregular cycle 11/06/2016  ? ? ?Patient Active Problem List  ? Diagnosis Date Noted  ? 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 Prediabetes 12/05/2020  ? Menorrhagia with irregular cycle 11/06/2016  ? Multiple food allergies 11/28/2012  ? Iron deficiency anemia 11/14/2012  ? Uterine fibroid 11/14/2012  ? Obesity, unspecified 11/14/2012  ? Seasonal allergies 12/15/2010  ? Essential hypertension 03/01/2006  ? ? ?Past Surgical History:  ?Procedure Laterality Date  ? CESAREAN SECTION    ? TUBAL LIGATION  2011  ? ? ?OB History   ? ? Gravida  ?3  ? Para  ?2  ? Term  ?2  ? Preterm  ?0  ? AB  ?1  ? Living  ?2  ?  ? ? SAB  ?1  ? IAB  ?0  ? Ectopic  ?0  ? Multiple  ?0  ? Live Births  ?   ?   ?  ?  ? ? ? ?Home Medications   ? ?Prior to Admission medications   ?Medication Sig Start Date End Date Taking? Authorizing Provider  ?cyclobenzaprine (FLEXERIL) 10 MG tablet Take 1 tablet (10 mg total) by mouth 2 (two) times daily as needed for muscle spasms. 08/05/21  Yes Francene Finders, PA-C  ?predniSONE (DELTASONE) 20 MG tablet Take 2 tablets (40 mg total) by mouth daily with breakfast for 5 days. 08/05/21 08/10/21 Yes Francene Finders, PA-C  ?benzonatate (TESSALON PERLES) 100 MG capsule Take 1 capsule (100 mg total) by mouth 3 (three) times daily as needed. 05/21/21    Sharion Balloon, FNP  ?EPINEPHrine (EPI-PEN) 0.3 mg/0.3 mL SOAJ injection Inject 0.3 mLs (0.3 mg total) into the muscle once. 06/19/13   Blain Pais, MD  ?ferrous sulfate 325 (65 FE) MG tablet Take 1 tablet by mouth every other day. 11/04/19   [provider]  ?iron polysaccharides (NIFEREX) 150 MG capsule Take 1 capsule (150 mg total) by mouth daily. 12/06/20   Gaylan Gerold, DO  ?lisinopril-hydrochlorothiazide (ZESTORETIC) 10-12.5 MG tablet Take 1 tablet by mouth daily. 06/02/21 09/03/21  Gaylan Gerold, DO  ?Multiple Vitamin (MULTIVITAMIN WITH MINERALS) TABS tablet Take 1 tablet by mouth daily.    [provider]  ?naproxen (NAPROSYN) 500 MG tablet Take 1 tablet (500 mg total) by mouth 2 (two) times daily with a meal. As needed for pain or during heavy menstrual bleeding 03/11/21   Raspet, Junie Panning K, PA-C  ?oseltamivir (TAMIFLU) 75 MG capsule Take 1 capsule (75 mg total) by mouth 2 (two) times daily. 05/21/21   Sharion Balloon, FNP  ?tiZANidine (ZANAFLEX) 4 MG capsule Take 1 capsule (4 mg total) by mouth 3 (three) times daily. 03/11/21   Raspet, Derry Skill, PA-C  ? ? ?Family  History ?Family History  ?Problem Relation Age of Onset  ? Ovarian cancer Mother   ?     Mother died in her 41s, shorlty the pt was born   ? Breast cancer Cousin   ?     diagnosed at age 92  ? ? ?Social History ?Social History  ? ?Tobacco Use  ? Smoking status: Former  ?  Types: Cigarettes  ?  Quit date: 11/10/2007  ?  Years since quitting: 13.7  ? Smokeless tobacco: Never  ?Substance Use Topics  ? Alcohol use: No  ?  Alcohol/week: 0.0 standard drinks  ? Drug use: No  ? ? ? ?Allergies   ?Peanuts [nuts] and Shellfish allergy ? ? ?Review of Systems ?Review of Systems  ?Constitutional:  Negative for chills and fever.  ?Eyes:  Negative for discharge and redness.  ?Gastrointestinal:  Negative for abdominal pain, nausea and vomiting.  ?Musculoskeletal:  Positive for back pain.  ?Neurological:  Negative for weakness and numbness.   ? ? ?Physical Exam ?Triage Vital Signs ?ED Triage Vitals  ?Enc Vitals Group  ?   BP   ?   Pulse   ?   Resp   ?   Temp   ?   Temp src   ?   SpO2   ?   Weight   ?   Height   ?   Head Circumference   ?   Peak Flow   ?   Pain Score   ?   Pain Loc   ?   Pain Edu?   ?   Excl. in [place]?   ? ?No data found. ? ?Updated Vital Signs ?BP (!) 156/105 (BP Location: Left Arm)   Pulse 77   Temp 97.9 ?F (36.6 ?C) (Oral)   Resp 16   LMP 07/27/2021   SpO2 98%  ?   ? ?Physical Exam ?Vitals and nursing note reviewed.  ?Constitutional:   ?   General: She is not in acute distress. ?   Appearance: Normal appearance. She is not ill-appearing.  ?HENT:  ?   Head: Normocephalic and atraumatic.  ?Eyes:  ?   Conjunctiva/sclera: Conjunctivae normal.  ?Cardiovascular:  ?   Rate and Rhythm: Normal rate.  ?Pulmonary:  ?   Effort: Pulmonary effort is normal.  ?Musculoskeletal:  ?   Comments: No midline TTP to L spine, mild TTP to right lower back  ?Neurological:  ?   Mental Status: She is alert.  ?Psychiatric:     ?   Mood and Affect: Mood normal.     ?   Behavior: Behavior normal.     ?   Thought Content: Thought content normal.  ? ? ? ?UC Treatments / Results  ?Labs ?(all labs ordered are listed, but only abnormal results are displayed) ?Labs Reviewed - No data to display ? ?EKG ? ? ?Radiology ?No results found. ? ?Procedures ?Procedures (including critical care time) ? ?Medications Ordered in UC ?Medications - No data to display ? ?Initial Impression / Assessment and Plan / UC Course  ?I have reviewed the triage vital signs and the nursing notes. ? ?Pertinent labs & imaging results that were available during my care of the patient were reviewed by me and considered in my medical decision making (see chart for details). ? ?  ?Suspect muscular strain. Will treat with steroid burst and muscle relaxer. Encouraged follow up with any further concerns.  ? ?Final Clinical Impressions(s) / UC Diagnoses  ? ?Final diagnoses:  ?  Acute right-sided low back  pain without sciatica  ? ?Discharge Instructions   ?None ?  ? ?ED Prescriptions   ? ? Medication Sig Dispense Auth. Provider  ? predniSONE (DELTASONE) 20 MG tablet Take 2 tablets (40 mg total) by mouth daily with breakfast for 5 days. 10 tablet Francene Finders, PA-C  ? cyclobenzaprine (FLEXERIL) 10 MG tablet Take 1 tablet (10 mg total) by mouth 2 (two) times daily as needed for muscle spasms. 20 tablet Francene Finders, PA-C  ? ?  ? ?PDMP not reviewed this encounter. ?  ?Francene Finders, PA-C ?08/05/21 1249 ? ?

## 2021-09-01 ENCOUNTER — Other Ambulatory Visit: Payer: Self-pay | Admitting: Student

## 2021-09-01 ENCOUNTER — Other Ambulatory Visit (HOSPITAL_COMMUNITY): Payer: Self-pay

## 2021-09-01 DIAGNOSIS — D500 Iron deficiency anemia secondary to blood loss (chronic): Secondary | ICD-10-CM

## 2021-09-02 ENCOUNTER — Other Ambulatory Visit (HOSPITAL_COMMUNITY): Payer: Self-pay

## 2021-09-02 MED ORDER — POLYSACCHARIDE IRON COMPLEX 150 MG PO CAPS
150.0000 mg | ORAL_CAPSULE | ORAL | 2 refills | Status: DC
  Filled 2021-09-02: qty 15, 30d supply, fill #0
  Filled 2021-10-21: qty 15, 30d supply, fill #1
  Filled 2021-11-14 – 2021-12-16 (×3): qty 15, 30d supply, fill #2

## 2021-09-03 ENCOUNTER — Other Ambulatory Visit: Payer: Self-pay

## 2021-09-05 ENCOUNTER — Other Ambulatory Visit (HOSPITAL_COMMUNITY): Payer: Self-pay

## 2021-09-06 ENCOUNTER — Other Ambulatory Visit (HOSPITAL_COMMUNITY): Payer: Self-pay

## 2021-09-06 ENCOUNTER — Other Ambulatory Visit: Payer: Self-pay

## 2021-09-09 ENCOUNTER — Ambulatory Visit: Payer: 59 | Admitting: Obstetrics & Gynecology

## 2021-09-09 ENCOUNTER — Other Ambulatory Visit (HOSPITAL_COMMUNITY): Payer: Self-pay

## 2021-09-09 ENCOUNTER — Encounter: Payer: Self-pay | Admitting: Obstetrics & Gynecology

## 2021-09-09 VITALS — BP 125/80 | HR 85 | Wt 234.4 lb

## 2021-09-09 DIAGNOSIS — Z1211 Encounter for screening for malignant neoplasm of colon: Secondary | ICD-10-CM

## 2021-09-09 DIAGNOSIS — Z23 Encounter for immunization: Secondary | ICD-10-CM | POA: Diagnosis not present

## 2021-09-09 DIAGNOSIS — N921 Excessive and frequent menstruation with irregular cycle: Secondary | ICD-10-CM | POA: Diagnosis not present

## 2021-09-09 DIAGNOSIS — Z113 Encounter for screening for infections with a predominantly sexual mode of transmission: Secondary | ICD-10-CM | POA: Diagnosis not present

## 2021-09-09 DIAGNOSIS — D500 Iron deficiency anemia secondary to blood loss (chronic): Secondary | ICD-10-CM

## 2021-09-09 MED ORDER — NAPROXEN 500 MG PO TABS
500.0000 mg | ORAL_TABLET | Freq: Two times a day (BID) | ORAL | 0 refills | Status: DC
  Filled 2021-09-09: qty 30, 15d supply, fill #0

## 2021-09-09 MED ORDER — MEGESTROL ACETATE 40 MG PO TABS
80.0000 mg | ORAL_TABLET | Freq: Every day | ORAL | 5 refills | Status: DC
  Filled 2021-09-09: qty 60, 30d supply, fill #0
  Filled 2021-10-21: qty 60, 15d supply, fill #1

## 2021-09-09 NOTE — Progress Notes (Signed)
? ?GYNECOLOGY OFFICE VISIT NOTE ? ?History:  ? Nancy Silva is a 46 y.o. [phone] here today for further discussion about management of her menorrhagia with now irregular cycles.  Was seen for this in 2021, started on Lysteda and Naproxen. This helped but she did not get it refilled.  Reports continued heavy bleeding requiring wearing pads and tampons, and has a period twice a month. This has led to iron deficiency anemia due to chronic blood loss, last hemoglobin on 12/05/20 was 8.5 and ferritin was 9. She has to receive iron therapy for this.  She wants to avoid hormone therapy as a long term treatment option, as she has a FH of breast cancer.  Of note, she had a ultrasound in 2021 which showed enlarged 14 weeks uterus but no focal lesions/fibroids or other anomalies.  Patient wants to discuss management options.  Patient is also interested in getting a colonoscopy and colon cancer screening and serum STI screening. She denies any current abnormal vaginal discharge, bleeding, pelvic pain or other concerns.  ?  ?Past Medical History:  ?Diagnosis Date  ? Hypertension   ? Iron deficiency anemia 11/14/2012  ? Menorrhagia with irregular cycle 11/06/2016  ? ? ?Past Surgical History:  ?Procedure Laterality Date  ? CESAREAN SECTION    ? TUBAL LIGATION  2011  ? ?The following portions of the patient's history were reviewed and updated as appropriate: allergies, current medications, past family history, past medical history, past social history, past surgical history and problem list.  ? ?Health Maintenance:  Normal pap and negative HRHPV on 11/14/2019.  Normal mammogram on 05/06/2021.  ? ?Review of Systems:  ?Pertinent items noted in HPI and remainder of comprehensive ROS otherwise negative. ? ?Physical Exam:  ?BP 125/80   Pulse 85   Wt 234 lb 6.4 oz (106.3 kg)   LMP 08/26/2021 (Exact Date)   BMI 36.71 kg/m?  ?CONSTITUTIONAL: Well-developed, well-nourished female in no acute distress.  ?HEENT:  Normocephalic,  atraumatic. External right and left ear normal. No scleral icterus.  ?NECK: Normal range of motion, supple, no masses noted on observation ?SKIN: No rash noted. Not diaphoretic. No erythema. No pallor. ?MUSCULOSKELETAL: Normal range of motion. No edema noted. ?NEUROLOGIC: Alert and oriented to person, place, and time. Normal muscle tone coordination. No cranial nerve deficit noted. ?PSYCHIATRIC: Normal mood and affect. Normal behavior. Normal judgment and thought content. ?CARDIOVASCULAR: Normal heart rate noted ?RESPIRATORY: Effort and breath sounds normal, no problems with respiration noted ?ABDOMEN: No masses noted. No other overt distention noted.   ?PELVIC: Deferred ?    ?Assessment and Plan:  ?   ?1. Screening for colon cancer ?Discussed need for colon cancer screening over the age 90. Colonoscopy recommended as this is the gold standard. Referral made to Gastroenterology for colonoscopy. ?- Ambulatory referral to Gastroenterology ? ?2. Routine screening for STI (sexually transmitted infection) ?STI screen done, will follow up results and manage accordingly. ?- RPR+HBsAg+HCVAb+HIV ? ?3. Need for diphtheria-tetanus-pertussis (Tdap) vaccine ?- Tdap vaccine greater than or equal to 7yo IM given ? ?4. Menorrhagia with irregular cycle ?Discussed management options for abnormal uterine bleeding including NSAIDs (Naproxen), tranexamic acid (Lysteda), oral progesterone, Depo Provera, Levonogestrel IUD, endometrial ablation or hysterectomy as definitive surgical management.  Discussed risks and benefits of each method.  Patient desires endometrial ablation.  Printed patient education handouts were given to the patient to review at home.  Discussed progestin therapy with Megace prescribed for now, bleeding precautions reviewed.  Labs checked, may need IV/oral iron  therapy. Patient will return for preoperative endometrial biopsy and pap smear, premedication with Naproxen recommended. Ultrasound also ordered to make sure  there have been no changes since last scan. ?- CBC ?- Ferritin ?- TSH Rfx on Abnormal to Free T4 ?- US PELVIC COMPLETE WITH TRANSVAGINAL; Future ?- Megestrol (MEGACE) 40 MG tablet; Take 2 tablets (80 mg total) by mouth daily. Can increase to two tablets twice a day in the event of heavy bleeding  Dispense: 60 tablet; Refill: 5 ?- Naproxen (NAPROSYN) 500 MG tablet; Take 1 tablet (500 mg total) by mouth 2 (two) times daily with a meal. As needed for pain or during heavy menstrual bleeding  Dispense: 30 tablet; Refill: 0 ? ?Routine preventative health maintenance measures emphasized. ?Please refer to After Visit Summary for other counseling recommendations.  ? ?Return in 4 weeks (on 10/07/2021) for Endometrial biopsy and pap smear, preop endometrial ablation.   ? ?I spent  35  minutes dedicated to the care of this patient including pre-visit review of records, face to face time with the patient discussing her conditions and treatments and post visit orders. ? ? ? ?Verita Schneiders, MD, FACOG ?Obstetrician Social research officer, government, Faculty Practice ?[facility] ?

## 2021-09-10 LAB — CBC
Hematocrit: 29.2 % — ABNORMAL LOW (ref 34.0–46.6)
Hemoglobin: 8.7 g/dL — ABNORMAL LOW (ref 11.1–15.9)
MCH: 20.8 pg — ABNORMAL LOW (ref 26.6–33.0)
MCHC: 29.8 g/dL — ABNORMAL LOW (ref 31.5–35.7)
MCV: 70 fL — ABNORMAL LOW (ref 79–97)
Platelets: 526 10*3/uL — ABNORMAL HIGH (ref 150–450)
RBC: 4.18 x10E6/uL (ref 3.77–5.28)
RDW: 16.4 % — ABNORMAL HIGH (ref 11.7–15.4)
WBC: 6.4 10*3/uL (ref 3.4–10.8)

## 2021-09-10 LAB — RPR+HBSAG+HCVAB+...
HIV Screen 4th Generation wRfx: NONREACTIVE
Hep C Virus Ab: NONREACTIVE
Hepatitis B Surface Ag: NEGATIVE
RPR Ser Ql: NONREACTIVE

## 2021-09-10 LAB — TSH RFX ON ABNORMAL TO FREE T4: TSH: 1.84 u[IU]/mL (ref 0.450–4.500)

## 2021-09-10 LAB — FERRITIN: Ferritin: 8 ng/mL — ABNORMAL LOW (ref 15–150)

## 2021-09-10 NOTE — Addendum Note (Signed)
Addended by: Verita Schneiders A on: 09/10/2021 01:16 PM ? ? Modules accepted: Orders ? ?

## 2021-09-10 NOTE — Progress Notes (Signed)
Patient has significant iron deficiency anemia. Iron infusions recommended - Venofer ordered weekly x 3 doses.  Please arrange this for patient.  Please call to inform patient of results and recommendations. ?

## 2021-09-11 ENCOUNTER — Telehealth: Payer: Self-pay | Admitting: *Deleted

## 2021-09-11 NOTE — Telephone Encounter (Signed)
-----   Message from Osborne Oman, MD sent at 09/10/2021  1:15 PM EDT ----- Patient has significant iron deficiency anemia. Iron infusions recommended - Venofer ordered weekly x 3 doses.  Please arrange this for patient.  Please call to inform patient of results and recommendations.

## 2021-09-11 NOTE — Telephone Encounter (Signed)
Called pt to let her know I scheduled her first infusion on May 25th at 8am.

## 2021-09-12 ENCOUNTER — Encounter: Payer: Self-pay | Admitting: Obstetrics & Gynecology

## 2021-09-15 ENCOUNTER — Other Ambulatory Visit: Payer: Self-pay

## 2021-09-15 ENCOUNTER — Other Ambulatory Visit (HOSPITAL_COMMUNITY): Payer: Self-pay

## 2021-09-18 ENCOUNTER — Ambulatory Visit
Admission: RE | Admit: 2021-09-18 | Discharge: 2021-09-18 | Disposition: A | Discharge status: Home / Self Care | Payer: 59 | Source: Ambulatory Visit | Attending: Obstetrics & Gynecology | Admitting: Obstetrics & Gynecology

## 2021-09-18 ENCOUNTER — Encounter (HOSPITAL_COMMUNITY)
Admission: RE | Admit: 2021-09-18 | Discharge: 2021-09-18 | Disposition: A | Discharge status: Home / Self Care | Payer: 59 | Source: Ambulatory Visit | Attending: Obstetrics & Gynecology | Admitting: Obstetrics & Gynecology

## 2021-09-18 DIAGNOSIS — D500 Iron deficiency anemia secondary to blood loss (chronic): Secondary | ICD-10-CM | POA: Diagnosis present

## 2021-09-18 DIAGNOSIS — N921 Excessive and frequent menstruation with irregular cycle: Secondary | ICD-10-CM | POA: Insufficient documentation

## 2021-09-18 MED ORDER — SODIUM CHLORIDE 0.9 % IV SOLN
300.0000 mg | INTRAVENOUS | Status: DC
  Administered 2021-09-18: 300 mg via INTRAVENOUS
  Filled 2021-09-18: qty 300

## 2021-09-20 ENCOUNTER — Other Ambulatory Visit: Payer: Self-pay | Admitting: Student

## 2021-09-20 DIAGNOSIS — I10 Essential (primary) hypertension: Secondary | ICD-10-CM

## 2021-09-23 ENCOUNTER — Other Ambulatory Visit (HOSPITAL_COMMUNITY): Payer: Self-pay

## 2021-09-23 MED ORDER — LISINOPRIL-HYDROCHLOROTHIAZIDE 10-12.5 MG PO TABS
1.0000 | ORAL_TABLET | Freq: Every day | ORAL | 2 refills | Status: DC
  Filled 2021-09-23: qty 30, 30d supply, fill #0
  Filled 2021-11-14 – 2021-12-12 (×2): qty 30, 30d supply, fill #1

## 2021-09-24 ENCOUNTER — Other Ambulatory Visit (HOSPITAL_COMMUNITY): Payer: Self-pay

## 2021-09-29 ENCOUNTER — Encounter (HOSPITAL_COMMUNITY)
Admission: RE | Admit: 2021-09-29 | Discharge: 2021-09-29 | Disposition: A | Discharge status: Home / Self Care | Payer: 59 | Source: Ambulatory Visit | Attending: Obstetrics & Gynecology | Admitting: Obstetrics & Gynecology

## 2021-09-29 DIAGNOSIS — D500 Iron deficiency anemia secondary to blood loss (chronic): Secondary | ICD-10-CM | POA: Insufficient documentation

## 2021-09-29 DIAGNOSIS — N921 Excessive and frequent menstruation with irregular cycle: Secondary | ICD-10-CM | POA: Insufficient documentation

## 2021-09-29 MED ORDER — SODIUM CHLORIDE 0.9 % IV SOLN
300.0000 mg | INTRAVENOUS | Status: DC
  Administered 2021-09-29: 300 mg via INTRAVENOUS
  Filled 2021-09-29: qty 15

## 2021-10-02 ENCOUNTER — Other Ambulatory Visit (HOSPITAL_COMMUNITY): Payer: Self-pay

## 2021-10-06 ENCOUNTER — Encounter (HOSPITAL_COMMUNITY)
Admission: RE | Admit: 2021-10-06 | Discharge: 2021-10-06 | Disposition: A | Discharge status: Home / Self Care | Payer: 59 | Source: Ambulatory Visit | Attending: Obstetrics & Gynecology | Admitting: Obstetrics & Gynecology

## 2021-10-06 DIAGNOSIS — N921 Excessive and frequent menstruation with irregular cycle: Secondary | ICD-10-CM

## 2021-10-06 DIAGNOSIS — D500 Iron deficiency anemia secondary to blood loss (chronic): Secondary | ICD-10-CM

## 2021-10-06 MED ORDER — EPINEPHRINE PF 1 MG/ML IJ SOLN: 0.3000 mg | Freq: Once | INTRAMUSCULAR | Status: DC | PRN

## 2021-10-06 MED ORDER — ALBUTEROL SULFATE (2.5 MG/3ML) 0.083% IN NEBU: 2.5000 mg | INHALATION_SOLUTION | Freq: Once | RESPIRATORY_TRACT | Status: DC | PRN

## 2021-10-06 MED ORDER — SODIUM CHLORIDE 0.9 % IV SOLN
300.0000 mg | INTRAVENOUS | Status: DC
  Administered 2021-10-06: 300 mg via INTRAVENOUS
  Filled 2021-10-06: qty 300

## 2021-10-06 MED ORDER — SODIUM CHLORIDE 0.9 % IV SOLN: INTRAVENOUS | Status: DC | PRN

## 2021-10-06 MED ORDER — SODIUM CHLORIDE 0.9 % IV BOLUS: 500.0000 mL | Freq: Once | INTRAVENOUS | Status: DC | PRN

## 2021-10-06 MED ORDER — DIPHENHYDRAMINE HCL 50 MG/ML IJ SOLN: 25.0000 mg | Freq: Once | INTRAMUSCULAR | Status: DC | PRN

## 2021-10-06 MED ORDER — METHYLPREDNISOLONE SODIUM SUCC 125 MG IJ SOLR: 125.0000 mg | Freq: Once | INTRAMUSCULAR | Status: DC | PRN

## 2021-10-21 ENCOUNTER — Encounter: Payer: Self-pay | Admitting: Obstetrics & Gynecology

## 2021-10-21 ENCOUNTER — Other Ambulatory Visit: Payer: Self-pay | Admitting: Obstetrics & Gynecology

## 2021-10-21 ENCOUNTER — Other Ambulatory Visit (HOSPITAL_COMMUNITY): Payer: Self-pay

## 2021-10-21 ENCOUNTER — Ambulatory Visit: Payer: 59 | Admitting: Obstetrics & Gynecology

## 2021-10-21 ENCOUNTER — Other Ambulatory Visit (HOSPITAL_COMMUNITY)
Admission: RE | Admit: 2021-10-21 | Discharge: 2021-10-21 | Disposition: A | Discharge status: Home / Self Care | Payer: 59 | Source: Ambulatory Visit | Attending: Obstetrics & Gynecology | Admitting: Obstetrics & Gynecology

## 2021-10-21 VITALS — BP 139/83 | HR 91 | Wt 244.4 lb

## 2021-10-21 DIAGNOSIS — Z01812 Encounter for preprocedural laboratory examination: Secondary | ICD-10-CM | POA: Diagnosis not present

## 2021-10-21 DIAGNOSIS — N939 Abnormal uterine and vaginal bleeding, unspecified: Secondary | ICD-10-CM

## 2021-10-21 LAB — POCT URINE PREGNANCY: Preg Test, Ur: NEGATIVE

## 2021-10-22 ENCOUNTER — Other Ambulatory Visit (HOSPITAL_COMMUNITY): Payer: Self-pay

## 2021-10-23 LAB — CYTOLOGY - PAP
Chlamydia: NEGATIVE
Comment: NEGATIVE
Comment: NEGATIVE
Comment: NEGATIVE
Comment: NORMAL
Diagnosis: NEGATIVE
High risk HPV: NEGATIVE
Neisseria Gonorrhea: NEGATIVE
Trichomonas: NEGATIVE

## 2021-10-24 ENCOUNTER — Other Ambulatory Visit (HOSPITAL_COMMUNITY): Payer: Self-pay

## 2021-10-27 ENCOUNTER — Encounter: Payer: Self-pay | Admitting: Student

## 2021-10-27 ENCOUNTER — Other Ambulatory Visit: Payer: Self-pay

## 2021-10-27 ENCOUNTER — Ambulatory Visit: Payer: 59 | Admitting: Student

## 2021-10-27 VITALS — BP 155/85 | HR 82 | Temp 97.8°F | Ht 67.0 in | Wt 244.0 lb

## 2021-10-27 DIAGNOSIS — Z87891 Personal history of nicotine dependence: Secondary | ICD-10-CM | POA: Diagnosis not present

## 2021-10-27 DIAGNOSIS — I10 Essential (primary) hypertension: Secondary | ICD-10-CM | POA: Diagnosis not present

## 2021-10-27 DIAGNOSIS — Z0000 Encounter for general adult medical examination without abnormal findings: Secondary | ICD-10-CM

## 2021-10-27 DIAGNOSIS — D500 Iron deficiency anemia secondary to blood loss (chronic): Secondary | ICD-10-CM

## 2021-10-27 NOTE — Assessment & Plan Note (Signed)
She is due for colonoscopy.  We will discuss this at next visit after D&C.

## 2021-10-27 NOTE — Assessment & Plan Note (Addendum)
Her blood pressure is mildly elevated today at 142/81.  Patient did not take her morning dose of lisinopril-HCTZ.  She normally is taking medication in the morning.  Current regimen: Lisinopril-HCTZ 10-12.5 mg daily.  Per chart review, her blood pressure was well controlled at her OB/GYN's visit with systolic in the 339P-792B.  Her elevated blood pressure could be secondary to missing a dose of her medication.  Given that she will be having an operation on 7/19, will not adjust the medication to avoid complication.  -BMP today -Return in about 1 month after the D&C for BP recheck  Addendum BMP unremarkable except for hyperglycemia Consider checking A1c at next visit

## 2021-10-27 NOTE — Patient Instructions (Addendum)
Ms. Luscher,  It was a pleasure seeing you in the clinic today.  Here is a summary what we talked about:  1.  Your blood pressure is slightly higher than normal today.  This could be from missing a dose of your medication this morning.  I saw that your blood pressure was well controlled at your OB/GYN office visit.  Please try to take your medication consistently every morning.  2.  I will check blood work for your kidney function today.  3.  I am glad that your OB/GYN doctor is addressing the heavy bleeding.  When you see [place] next time, I will recheck your iron level.  4.  We will talk about colonoscopy at next visit.  Please return in about 1 month for blood pressure recheck.  Take care,  Dr. Alfonse Spruce

## 2021-10-27 NOTE — Progress Notes (Signed)
   CC: Follow-up on hypertension  HPI:  [name] is a 46 y.o. with past medical history of hypertension, iron deficiency anemia who presents to the clinic today to follow-up on her blood pressure.  Past Medical History:  Diagnosis Date   Hypertension    Iron deficiency anemia 11/14/2012   Menorrhagia with irregular cycle 11/06/2016   Review of Systems:  Per HPI  Physical Exam:  Vitals:   10/27/21 1544 10/27/21 1610  BP: (!) 142/81 (!) 155/85  Pulse: 95 82  Temp: 97.8 F (36.6 C)   TempSrc: Oral   SpO2: 100%   Weight: 244 lb (110.7 kg)   Height: '5\' 7"'$  (1.702 m)    Physical Exam Constitutional:      General: She is not in acute distress.    Appearance: She is not ill-appearing.  HENT:     Head: Normocephalic.  Eyes:     General:        Right eye: No discharge.        Left eye: No discharge.     Conjunctiva/sclera: Conjunctivae normal.  Cardiovascular:     Rate and Rhythm: Normal rate and regular rhythm.     Heart sounds: Normal heart sounds. No murmur heard.    Comments: No LE edema Pulmonary:     Effort: Pulmonary effort is normal. No respiratory distress.     Breath sounds: Normal breath sounds. No wheezing.  Musculoskeletal:        General: Normal range of motion.  Skin:    General: Skin is warm.  Neurological:     General: No focal deficit present.     Mental Status: She is alert.  Psychiatric:        Mood and Affect: Mood normal.      Assessment & Plan:   Essential hypertension Her blood pressure is mildly elevated today at 142/81.  Patient did not take her morning dose of lisinopril-HCTZ.  She normally is taking medication in the morning.  Current regimen: Lisinopril-HCTZ 10-12.5 mg daily.  Per chart review, her blood pressure was well controlled at her OB/GYN's visit with systolic in the 010X-323F.  Her elevated blood pressure could be secondary to missing a dose of her medication.  Given that she will be having an operation on 7/19,  will not adjust the medication to avoid complication.  -BMP today -Return in about 1 month after the D&C for BP recheck  Iron deficiency anemia Iron deficiency anemia in the setting of menorrhalgia. pelvic ultrasound showed a 18 mm hypoechoic mass at the left uterine fundus, patient underwent endometrial biopsy which was negative for malignancy.  Given her ongoing menorrhagia, plan for D&C on 7/19.  She was also started on a short course of Megace which helps with the bleeding.  She reports adherence to p.o. iron supplement.  She also received 3 doses of IV Venofer last month as well.  She denies any side effects from p.o. iron supplement except for mild constipation.  -We will obtain iron panel at next visit.  Healthcare maintenance She is due for colonoscopy.  We will discuss this at next visit after D&C.   See Encounters Tab for problem based charting.  Patient discussed with Dr.  Cain Sieve

## 2021-10-27 NOTE — Assessment & Plan Note (Addendum)
Iron deficiency anemia in the setting of menorrhalgia. pelvic ultrasound showed a 18 mm hypoechoic mass at the left uterine fundus, patient underwent endometrial biopsy which was negative for malignancy.  Given her ongoing menorrhagia, plan for D&C on 7/19.  She was also started on a short course of Megace which helps with the bleeding.  She reports adherence to p.o. iron supplement.  She also received 3 doses of IV Venofer last month as well.  She denies any side effects from p.o. iron supplement except for mild constipation.  -We will obtain iron panel at next visit.

## 2021-10-28 LAB — BMP8+ANION GAP
Anion Gap: 17 mmol/L (ref 10.0–18.0)
BUN/Creatinine Ratio: 13 (ref 9–23)
BUN: 11 mg/dL (ref 6–24)
CO2: 20 mmol/L (ref 20–29)
Calcium: 9.2 mg/dL (ref 8.7–10.2)
Chloride: 100 mmol/L (ref 96–106)
Creatinine, Ser: 0.88 mg/dL (ref 0.57–1.00)
Glucose: 253 mg/dL — ABNORMAL HIGH (ref 70–99)
Potassium: 4 mmol/L (ref 3.5–5.2)
Sodium: 137 mmol/L (ref 134–144)
eGFR: 82 mL/min/{1.73_m2} (ref >59)

## 2021-10-29 NOTE — Progress Notes (Signed)
Internal Medicine Clinic Attending  Case discussed with Dr. Nguyen  At the time of the visit.  We reviewed the resident's history and exam and pertinent patient test results.  I agree with the assessment, diagnosis, and plan of care documented in the resident's note. 

## 2021-10-31 ENCOUNTER — Encounter (HOSPITAL_BASED_OUTPATIENT_CLINIC_OR_DEPARTMENT_OTHER): Payer: Self-pay | Admitting: Obstetrics & Gynecology

## 2021-11-03 ENCOUNTER — Encounter (HOSPITAL_BASED_OUTPATIENT_CLINIC_OR_DEPARTMENT_OTHER): Payer: Self-pay | Admitting: Obstetrics & Gynecology

## 2021-11-03 NOTE — Progress Notes (Signed)
11/03/2021 1:14 PM Spoke w/ via phone for pre-op interview---patient Lab needs dos---- CBG, Chem 8, EKG              Lab results------in Epic from 10/27/21 COVID test -----patient states asymptomatic no test needed Arrive at -------1045 NPO after MN NO Solid Food.  Clear liquids from MN until---0945 Med rec completed Medications to take morning of surgery -----none, hold Lisinopril-HCTZ am of surgery per protocol Diabetic medication -----n/a Patient instructed no nail polish to be worn day of surgery Patient instructed to bring photo id and insurance card day of surgery Patient aware to have Driver (ride ) / caregiver    for 24 hours after surgery  Patient Special Instructions ----none- Pre-Op special Istructions -----none  Patient verbalized understanding of instructions that were given at this phone interview. Patient denies shortness of breath, chest pain, fever, cough at this phone interview.  Asli Tokarski, Arville Lime

## 2021-11-12 ENCOUNTER — Encounter (HOSPITAL_BASED_OUTPATIENT_CLINIC_OR_DEPARTMENT_OTHER)
Admission: RE | Disposition: A | Discharge status: Home / Self Care | Payer: Self-pay | Source: Home / Self Care | Attending: Obstetrics & Gynecology

## 2021-11-12 ENCOUNTER — Ambulatory Visit (HOSPITAL_BASED_OUTPATIENT_CLINIC_OR_DEPARTMENT_OTHER): Payer: 59 | Admitting: Anesthesiology

## 2021-11-12 ENCOUNTER — Encounter (HOSPITAL_BASED_OUTPATIENT_CLINIC_OR_DEPARTMENT_OTHER): Payer: Self-pay | Admitting: Obstetrics & Gynecology

## 2021-11-12 ENCOUNTER — Ambulatory Visit (HOSPITAL_BASED_OUTPATIENT_CLINIC_OR_DEPARTMENT_OTHER)
Admission: RE | Admit: 2021-11-12 | Discharge: 2021-11-12 | Disposition: A | Discharge status: Home / Self Care | Payer: 59 | Attending: Obstetrics & Gynecology | Admitting: Obstetrics & Gynecology

## 2021-11-12 ENCOUNTER — Other Ambulatory Visit: Payer: Self-pay

## 2021-11-12 DIAGNOSIS — N939 Abnormal uterine and vaginal bleeding, unspecified: Secondary | ICD-10-CM | POA: Diagnosis present

## 2021-11-12 DIAGNOSIS — N921 Excessive and frequent menstruation with irregular cycle: Secondary | ICD-10-CM | POA: Diagnosis present

## 2021-11-12 DIAGNOSIS — I10 Essential (primary) hypertension: Secondary | ICD-10-CM

## 2021-11-12 DIAGNOSIS — R7303 Prediabetes: Secondary | ICD-10-CM

## 2021-11-12 DIAGNOSIS — D509 Iron deficiency anemia, unspecified: Secondary | ICD-10-CM | POA: Diagnosis present

## 2021-11-12 HISTORY — DX: Abnormal uterine and vaginal bleeding, unspecified: N93.9

## 2021-11-12 HISTORY — DX: Prediabetes: R73.03

## 2021-11-12 HISTORY — PX: DILITATION & CURRETTAGE/HYSTROSCOPY WITH HYDROTHERMAL ABLATION: SHX5570

## 2021-11-12 HISTORY — DX: Unspecified osteoarthritis, unspecified site: M19.90

## 2021-11-12 HISTORY — DX: Personal history of other diseases of the nervous system and sense organs: Z86.69

## 2021-11-12 LAB — POCT I-STAT, CHEM 8
BUN: 10 mg/dL (ref 6–20)
Calcium, Ion: 1.24 mmol/L (ref 1.15–1.40)
Chloride: 99 mmol/L (ref 98–111)
Creatinine, Ser: 0.6 mg/dL (ref 0.44–1.00)
Glucose, Bld: 233 mg/dL — ABNORMAL HIGH (ref 70–99)
HCT: 43 % (ref 36.0–46.0)
Hemoglobin: 14.6 g/dL (ref 12.0–15.0)
Potassium: 3.6 mmol/L (ref 3.5–5.1)
Sodium: 140 mmol/L (ref 135–145)
TCO2: 24 mmol/L (ref 22–32)

## 2021-11-12 LAB — POCT PREGNANCY, URINE: Preg Test, Ur: NEGATIVE

## 2021-11-12 SURGERY — DILATATION & CURETTAGE/HYSTEROSCOPY WITH HYDROTHERMAL ABLATION
Anesthesia: General | Site: Vagina

## 2021-11-12 MED ORDER — GABAPENTIN 300 MG PO CAPS
ORAL_CAPSULE | ORAL | Status: AC
  Filled 2021-11-12: qty 1

## 2021-11-12 MED ORDER — AMISULPRIDE (ANTIEMETIC) 5 MG/2ML IV SOLN: 10.0000 mg | Freq: Once | INTRAVENOUS | Status: DC | PRN

## 2021-11-12 MED ORDER — LACTATED RINGERS IV SOLN: INTRAVENOUS | Status: DC

## 2021-11-12 MED ORDER — MIDAZOLAM HCL 2 MG/2ML IJ SOLN
INTRAMUSCULAR | Status: AC
  Filled 2021-11-12: qty 2

## 2021-11-12 MED ORDER — OXYCODONE HCL 5 MG/5ML PO SOLN: 5.0000 mg | Freq: Once | ORAL | Status: DC | PRN

## 2021-11-12 MED ORDER — OXYCODONE HCL 5 MG PO TABS: 5.0000 mg | ORAL_TABLET | Freq: Once | ORAL | Status: DC | PRN

## 2021-11-12 MED ORDER — DOCUSATE SODIUM 100 MG PO CAPS: 100.0000 mg | ORAL_CAPSULE | Freq: Two times a day (BID) | ORAL | 2 refills | Status: DC | PRN

## 2021-11-12 MED ORDER — SODIUM CHLORIDE 0.9 % IR SOLN
Status: DC | PRN
  Administered 2021-11-12: 3000 mL

## 2021-11-12 MED ORDER — PROPOFOL 1000 MG/100ML IV EMUL
INTRAVENOUS | Status: AC
  Filled 2021-11-12: qty 100

## 2021-11-12 MED ORDER — ONDANSETRON HCL 4 MG/2ML IJ SOLN
INTRAMUSCULAR | Status: DC | PRN
  Administered 2021-11-12: 4 mg via INTRAVENOUS

## 2021-11-12 MED ORDER — GABAPENTIN 300 MG PO CAPS
300.0000 mg | ORAL_CAPSULE | ORAL | Status: AC
  Administered 2021-11-12: 300 mg via ORAL

## 2021-11-12 MED ORDER — ONDANSETRON HCL 4 MG/2ML IJ SOLN: 4.0000 mg | Freq: Once | INTRAMUSCULAR | Status: DC | PRN

## 2021-11-12 MED ORDER — KETOROLAC TROMETHAMINE 30 MG/ML IJ SOLN
INTRAMUSCULAR | Status: DC | PRN
  Administered 2021-11-12: 30 mg via INTRAVENOUS

## 2021-11-12 MED ORDER — ACETAMINOPHEN 500 MG PO TABS
1000.0000 mg | ORAL_TABLET | ORAL | Status: AC
  Administered 2021-11-12: 1000 mg via ORAL

## 2021-11-12 MED ORDER — PROPOFOL 10 MG/ML IV BOLUS
INTRAVENOUS | Status: DC | PRN
  Administered 2021-11-12: 200 mg via INTRAVENOUS

## 2021-11-12 MED ORDER — BUPIVACAINE HCL (PF) 0.5 % IJ SOLN
INTRAMUSCULAR | Status: DC | PRN
  Administered 2021-11-12: 30 mL

## 2021-11-12 MED ORDER — FENTANYL CITRATE (PF) 100 MCG/2ML IJ SOLN
INTRAMUSCULAR | Status: DC | PRN
  Administered 2021-11-12 (×2): 50 ug via INTRAVENOUS

## 2021-11-12 MED ORDER — POVIDONE-IODINE 10 % EX SWAB: 2.0000 "application " | Freq: Once | CUTANEOUS | Status: DC

## 2021-11-12 MED ORDER — LIDOCAINE HCL (CARDIAC) PF 100 MG/5ML IV SOSY
PREFILLED_SYRINGE | INTRAVENOUS | Status: DC | PRN
  Administered 2021-11-12: 40 mg via INTRAVENOUS

## 2021-11-12 MED ORDER — OXYCODONE HCL 5 MG PO TABS: 5.0000 mg | ORAL_TABLET | ORAL | 0 refills | Status: DC | PRN

## 2021-11-12 MED ORDER — POVIDONE-IODINE 10 % EX SWAB
Freq: Once | CUTANEOUS | Status: DC
Start: 2021-11-12 — End: 2021-11-12

## 2021-11-12 MED ORDER — FENTANYL CITRATE (PF) 100 MCG/2ML IJ SOLN: 25.0000 ug | INTRAMUSCULAR | Status: DC | PRN

## 2021-11-12 MED ORDER — IBUPROFEN 800 MG PO TABS: 800.0000 mg | ORAL_TABLET | Freq: Three times a day (TID) | ORAL | 3 refills | Status: DC | PRN

## 2021-11-12 MED ORDER — MIDAZOLAM HCL 2 MG/2ML IJ SOLN
INTRAMUSCULAR | Status: DC | PRN
  Administered 2021-11-12: 2 mg via INTRAVENOUS

## 2021-11-12 MED ORDER — ACETAMINOPHEN 500 MG PO TABS
ORAL_TABLET | ORAL | Status: AC
  Filled 2021-11-12: qty 2

## 2021-11-12 MED ORDER — FENTANYL CITRATE (PF) 100 MCG/2ML IJ SOLN
INTRAMUSCULAR | Status: AC
  Filled 2021-11-12: qty 2

## 2021-11-12 MED ORDER — DEXAMETHASONE SODIUM PHOSPHATE 4 MG/ML IJ SOLN
INTRAMUSCULAR | Status: DC | PRN
  Administered 2021-11-12: 8 mg via INTRAVENOUS

## 2021-11-12 SURGICAL SUPPLY — 25 items
CATH ROBINSON RED A/P 16FR (CATHETERS) ×2 IMPLANT
COVER BACK TABLE 60X90IN (DRAPES) ×2 IMPLANT
COVER MAYO STAND STRL (DRAPES) ×3 IMPLANT
COVER PROBE U/S 5X48 (MISCELLANEOUS) IMPLANT
DRAPE HYSTEROSCOPY (MISCELLANEOUS) ×1 IMPLANT
DRAPE SHEET LG 3/4 BI-LAMINATE (DRAPES) ×3 IMPLANT
GAUZE 4X4 16PLY ~~LOC~~+RFID DBL (SPONGE) ×2 IMPLANT
GLOVE BIO SURGEON STRL SZ7.5 (GLOVE) ×3 IMPLANT
GOWN STRL REUS W/TWL LRG LVL3 (GOWN DISPOSABLE) ×3 IMPLANT
HIBICLENS CHG 4% 4OZ BTL (MISCELLANEOUS) ×1 IMPLANT
IV NS IRRIG 3000ML ARTHROMATIC (IV SOLUTION) ×2 IMPLANT
KIT PROCEDURE FLUENT (KITS) IMPLANT
KIT TURNOVER CYSTO (KITS) ×2 IMPLANT
LEGGING LITHOTOMY PAIR STRL (DRAPES) ×2 IMPLANT
MANIFOLD NEPTUNE II (INSTRUMENTS) IMPLANT
NS IRRIG 500ML POUR BTL (IV SOLUTION) ×1 IMPLANT
PACK BASIN DAY SURGERY FS (CUSTOM PROCEDURE TRAY) ×1 IMPLANT
PACK VAGINAL MINOR WOMEN LF (CUSTOM PROCEDURE TRAY) ×1 IMPLANT
PAD OB MATERNITY 4.3X12.25 (PERSONAL CARE ITEMS) ×2 IMPLANT
PAD PREP 24X48 CUFFED NSTRL (MISCELLANEOUS) ×2 IMPLANT
SET GENESYS HTA PROCERVA (MISCELLANEOUS) ×3 IMPLANT
TOWEL OR 17X26 10 PK STRL BLUE (TOWEL DISPOSABLE) ×3 IMPLANT
TRAY DSU PREP LF (CUSTOM PROCEDURE TRAY) ×1 IMPLANT
TUBE CONNECTING 12X1/4 (SUCTIONS) IMPLANT
WATER STERILE IRR 500ML POUR (IV SOLUTION) ×1 IMPLANT

## 2021-11-12 NOTE — Anesthesia Postprocedure Evaluation (Signed)
Anesthesia Post Note  Patient: Nancy Silva  Procedure(s) Performed: DILATATION & /HYSTEROSCOPY WITH HYDROTHERMAL ABLATION (Vagina )     Patient location during evaluation: PACU Anesthesia Type: General Level of consciousness: awake and alert Pain management: pain level controlled Vital Signs Assessment: post-procedure vital signs reviewed and stable Respiratory status: spontaneous breathing, nonlabored ventilation and respiratory function stable Cardiovascular status: blood pressure returned to baseline and stable Postop Assessment: no apparent nausea or vomiting Anesthetic complications: no   No notable events documented.  Last Vitals:  Vitals:   11/12/21 1415 11/12/21 1424  BP: (!) 143/89 (!) 148/87  Pulse: 83 83  Resp: 13 (!) 25  Temp:    SpO2: 96% 95%    Last Pain:  Vitals:   11/12/21 1345  TempSrc:   PainSc: Asleep                 Lidia Collum

## 2021-11-12 NOTE — Transfer of Care (Signed)
Immediate Anesthesia Transfer of Care Note  Patient: Nancy Silva  Procedure(s) Performed: DILATATION & CURETTAGE/HYSTEROSCOPY WITH HYDROTHERMAL ABLATION (Vagina )  Patient Location: PACU  Anesthesia Type:General  Level of Consciousness: awake and patient cooperative  Airway & Oxygen Therapy: Patient Spontanous Breathing and Patient connected to nasal cannula oxygen  Post-op Assessment: Report given to RN and Post -op Vital signs reviewed and stable  Post vital signs: Reviewed and stable  Last Vitals:  Vitals Value Taken Time  BP 152/92 11/12/21 1345  Temp 36.3 C 11/12/21 1345  Pulse 101 11/12/21 1347  Resp 20 11/12/21 1347  SpO2 98 % 11/12/21 1347  Vitals shown include unvalidated device data.  Last Pain:  Vitals:   11/12/21 1109  TempSrc: Oral  PainSc: 0-No pain      Patients Stated Pain Goal: 5 (43/27/61 4709)  Complications: No notable events documented.

## 2021-11-12 NOTE — H&P (Signed)
Preoperative History and Physical  Nancy Silva is a 46 y.o. [id number] here for surgical management of abnormal uterine bleeding.   No significant preoperative concerns.  Proposed surgery: Hysteroscopy, Hydrothermal Endometrial Ablation.  Past Medical History:  Diagnosis Date   Abnormal uterine bleeding (AUB)    Arthritis    History of seizures as a child    none since age 53 per patient   Hypertension    Iron deficiency anemia    Pre-diabetes    Past Surgical History:  Procedure Laterality Date   CESAREAN SECTION WITH BILATERAL TUBAL LIGATION  02/21/2010   '@[facility]'$    OB History  Gravida Para Term Preterm AB Living  '3 2 2 '$ 0 1 2  SAB IAB Ectopic Multiple Live Births  1 0 0 0      # Outcome Date GA Lbr Len/2nd Weight Sex Delivery Anes PTL Lv  3 SAB           2 Term           1 Term           Patient denies any other pertinent gynecologic issues.   No current facility-administered medications on file prior to encounter.   Current Outpatient Medications on File Prior to Encounter  Medication Sig Dispense Refill   cyclobenzaprine (FLEXERIL) 10 MG tablet Take 1 tablet (10 mg total) by mouth 2 (two) times daily as needed for muscle spasms. 20 tablet 0   iron polysaccharides (FERREX 150) 150 MG capsule Take 1 capsule (150 mg total) by mouth every other day. 15 capsule 2   megestrol (MEGACE) 40 MG tablet Take 2 tablets (80 mg total) by mouth daily. Can increase to two tablets twice a day in the event of heavy bleeding 60 tablet 5   Multiple Vitamin (MULTIVITAMIN WITH MINERALS) TABS tablet Take 1 tablet by mouth daily.     naproxen (NAPROSYN) 500 MG tablet Take 1 tablet (500 mg total) by mouth 2 (two) times daily with a meal. As needed for pain or during heavy menstrual bleeding 30 tablet 0   tiZANidine (ZANAFLEX) 4 MG capsule Take 1 capsule (4 mg total) by mouth 3 (three) times daily. 30 capsule 0   EPINEPHrine (EPI-PEN) 0.3 mg/0.3 mL SOAJ injection Inject 0.3 mLs (0.3 mg total)  into the muscle once. (Patient not taking: Reported on 09/09/2021) 1 Device 3   Allergies  Allergen Reactions   Peanuts [Nuts]     Throat swelling   Shellfish Allergy     Throat Swelling    Social History:   reports that she quit smoking about 14 years ago. Her smoking use included cigarettes. She has never used smokeless tobacco. She reports that she does not drink alcohol and does not use drugs.  Family History  Problem Relation Age of Onset   Ovarian cancer Mother        Mother died in her 48s, shorlty the pt was born    Breast cancer Cousin        diagnosed at age 52    Review of Systems: Pertinent items noted in HPI and remainder of comprehensive ROS otherwise negative.  PHYSICAL EXAM: Blood pressure (!) 148/86, pulse 90, temperature 98.7 F (37.1 C), temperature source Oral, resp. rate 17, height '5\' 7"'$  (1.702 m), weight 106.6 kg, last menstrual period 10/27/2021, SpO2 96 %. CONSTITUTIONAL: Well-developed, well-nourished female in no acute distress.  HENT:  Normocephalic, atraumatic, External right and left ear normal. Oropharynx is clear and moist EYES:  Conjunctivae and EOM are normal. Pupils are equal, round, and reactive to light. No scleral icterus.  NECK: Normal range of motion, supple, no masses SKIN: Skin is warm and dry. No rash noted. Not diaphoretic. No erythema. No pallor. NEUROLOGIC: Alert and oriented to person, place, and time. Normal reflexes, muscle tone coordination. No cranial nerve deficit noted. PSYCHIATRIC: Normal mood and affect. Normal behavior. Normal judgment and thought content. CARDIOVASCULAR: Normal heart rate noted, regular rhythm RESPIRATORY: Effort and breath sounds normal, no problems with respiration noted ABDOMEN: Soft, nontender, nondistended. PELVIC: Deferred MUSCULOSKELETAL: Normal range of motion. No edema and no tenderness. 2+ distal pulses.  Labs: Results for orders placed or performed during the hospital encounter of 11/12/21 (from  the past 72 hour(s))  Pregnancy, urine POC     Status: None   Collection Time: 11/12/21 11:23 AM  Result Value Ref Range   Preg Test, Ur NEGATIVE NEGATIVE    Comment:        THE SENSITIVITY OF THIS METHODOLOGY IS >24 mIU/mL   I-STAT, chem 8     Status: Abnormal   Collection Time: 11/12/21 11:26 AM  Result Value Ref Range   Sodium 140 135 - 145 mmol/L   Potassium 3.6 3.5 - 5.1 mmol/L   Chloride 99 98 - 111 mmol/L   BUN 10 6 - 20 mg/dL   Creatinine, Ser 0.60 0.44 - 1.00 mg/dL   Glucose, Bld 233 (H) 70 - 99 mg/dL    Comment: Glucose reference range applies only to samples taken after fasting for at least 8 hours.   Calcium, Ion 1.24 1.15 - 1.40 mmol/L   TCO2 24 22 - 32 mmol/L   Hemoglobin 14.6 12.0 - 15.0 g/dL   HCT 43.0 36.0 - 46.0 %    10/21/2021 Endometrium, biopsy BENIGN ENDOMETRIAL POLYPS WITH PROGESTERONE INDUCED CHANGES IN THE STROMA. NEGATIVE FOR HYPERPLASIA AND MALIGNANCY.  Imaging Studies: US PELVIC COMPLETE WITH TRANSVAGINAL  Result Date: 09/18/2021 CLINICAL DATA:  Abnormal uterine bleeding EXAM: TRANSABDOMINAL AND TRANSVAGINAL ULTRASOUND OF PELVIS TECHNIQUE: Both transabdominal and transvaginal ultrasound examinations of the pelvis were performed. Transabdominal technique was performed for global imaging of the pelvis including uterus, ovaries, adnexal regions, and pelvic cul-de-sac. It was necessary to proceed with endovaginal exam following the transabdominal exam to visualize the uterus endometrium ovaries. COMPARISON:  11/21/2019 FINDINGS: Uterus Measurements: 11.2 x 6.5 x 8.1 cm = volume: 305.2 mL. No fibroids or other mass visualized. Endometrium Thickness: 8.6 mm. Hypoechoic mass at the left uterine fundus measuring 18 x 17 x 16 mm. Right ovary Measurements: 3.1 x 2 x 1.7 cm = volume: 5.5 mL. Normal appearance/no adnexal mass. Left ovary Not seen Other findings No abnormal free fluid. IMPRESSION: 1. 18 mm hypoechoic mass at the left uterine fundus indeterminate for  submucosal fibroid or endometrial mass. Consider further evaluation with sonohysterogram for confirmation prior to hysteroscopy. Endometrial sampling should also be considered if patient is at high risk for endometrial carcinoma. (Ref: Radiological Reasoning: Algorithmic Workup of Abnormal Vaginal Bleeding with Endovaginal Sonography and Sonohysterography. AJR 2008; 409:W11-91) 2. Nonvisualized left ovary Electronically Signed   By: Donavan Foil M.D.   On: 09/18/2021 17:29     Assessment: Principal Problem:   Abnormal uterine bleeding (AUB) Active Problems:   Iron deficiency anemia   Menorrhagia with irregular cycle  Plan: Patient will undergo surgical management with Hysteroscopy, Hydrothermal Endometrial Ablation.  Risks of surgery were discussed with the patient including but not limited to: bleeding; infection which may require antibiotics;  injury to uterus leading to risk of injury to surrounding intraperitoneal organs, burn injury to vagina or other organs, need for additional procedures including laparoscopy or laparotomy, inability to complete ablation due to uterine or mechanical anomaly, need for trial of another ablation modality, and other postoperative/anesthesia complications.  Patient was told that the likelihood that her condition and symptoms will be treated effectively with this surgical management was very high; the postoperative expectations were also discussed in detail.  Patient was told that it is normal to have menstrual bleeding after an endometrial ablation, only about 50% of patients become amenorrheic, 40% of patients have normal or light periods, and 5-10% of patients have no change in their bleeding pattern and may need further intervention. Patient also verified she is done with childbearing as this procedure is contraindicated for patient desiring future fertility; had tubal ligation for contraception. The patient also understands the alternative treatment options which  were discussed in full. All questions were answered.  The patient concurred with the proposed plan, giving informed written consent for the surgery.  Patient has been NPO since last night and she will remain NPO for procedure.  Anesthesia and OR aware.  Preoperative prophylactic antibiotics and SCDs ordered on call to the OR.  To OR when ready.    Verita Schneiders, MD, [place] for [facility], [place]

## 2021-11-12 NOTE — Anesthesia Preprocedure Evaluation (Signed)
Anesthesia Evaluation  Patient identified by MRN, date of birth, ID band Patient awake    Reviewed: Allergy & Precautions, NPO status , Patient's Chart, lab work & pertinent test results  History of Anesthesia Complications Negative for: history of anesthetic complications  Airway Mallampati: III  TM Distance: >3 FB Neck ROM: Full    Dental  (+) Teeth Intact, Dental Advisory Given   Pulmonary neg pulmonary ROS, former smoker,    Pulmonary exam normal        Cardiovascular hypertension, Normal cardiovascular exam     Neuro/Psych negative neurological ROS     GI/Hepatic negative GI ROS, Neg liver ROS,   Endo/Other  negative endocrine ROS  Renal/GU negative Renal ROS  negative genitourinary   Musculoskeletal negative musculoskeletal ROS (+)   Abdominal   Peds  Hematology negative hematology ROS (+)   Anesthesia Other Findings   Reproductive/Obstetrics Menorrhagia                            Anesthesia Physical Anesthesia Plan  ASA: 2  Anesthesia Plan: General   Post-op Pain Management: Tylenol PO (pre-op)* and Toradol IV (intra-op)*   Induction: Intravenous  PONV Risk Score and Plan: 3 and Ondansetron, Dexamethasone, Midazolam and Treatment may vary due to age or medical condition  Airway Management Planned: LMA  Additional Equipment: None  Intra-op Plan:   Post-operative Plan: Extubation in OR  Informed Consent: I have reviewed the patients History and Physical, chart, labs and discussed the procedure including the risks, benefits and alternatives for the proposed anesthesia with the patient or authorized representative who has indicated his/her understanding and acceptance.     Dental advisory given  Plan Discussed with:   Anesthesia Plan Comments:         Anesthesia Quick Evaluation

## 2021-11-12 NOTE — Discharge Instructions (Addendum)
      Post Anesthesia Home Care Instructions  Activity: Get plenty of rest for the remainder of the day. A responsible individual must stay with you for 24 hours following the procedure.  For the next 24 hours, DO NOT: -Drive a car -Paediatric nurse -Drink alcoholic beverages -Take any medication unless instructed by your physician -Make any legal decisions or sign important papers.  Meals: Start with liquid foods such as gelatin or soup. Progress to regular foods as tolerated. Avoid greasy, spicy, heavy foods. If nausea and/or vomiting occur, drink only clear liquids until the nausea and/or vomiting subsides. Call your physician if vomiting continues.  Special Instructions/Symptoms: Your throat may feel dry or sore from the anesthesia or the breathing tube placed in your throat during surgery. If this causes discomfort, gargle with warm salt water. The discomfort should disappear within 24 hours.      Do not take any Tylenol until after 5:15 pm today if needed.

## 2021-11-12 NOTE — Op Note (Signed)
PREOPERATIVE DIAGNOSIS:  Abnormal uterine bleeding POSTOPERATIVE DIAGNOSIS: The same PROCEDURE: Hysteroscopy,  Hydrothermal Endometrial Ablation SURGEON:  Dr. Verita Schneiders  INDICATIONS: 46 y.o. [id number] here for scheduled surgery for abnormal uterine bleeding. Risks of surgery were discussed with the patient including but not limited to: bleeding; infection which may require antibiotics; injury to uterus leading to risk of injury to surrounding intraperitoneal organs, burn injury to vagina or other organs, need for additional procedures including laparoscopy or laparotomy, inability to complete ablation due to uterine or mechanical anomaly, need for trial of another ablation modality, and other postoperative/anesthesia complications.  Patient was informed that there is a high likelihood of success of controlling her symptoms; however about 5% of patients may require further intervention.  Written informed consent was obtained.    FINDINGS:  A 11 week size uterus.  Diffuse proliferative endometrium.  Normal ostia bilaterally.  ANESTHESIA:   General -LMA, paracervical block with 30 ml of 0.5% Marcaine. ESTIMATED BLOOD LOSS:  3 ml SPECIMENS: None. COMPLICATIONS:  None immediate.  PROCEDURE DETAILS:  The patient was then taken to the operating room where general anesthesia was administered and was found to be adequate.  After an adequate timeout was performed, she was placed in the dorsal lithotomy position and examined; then prepped and draped in the sterile manner.   Her bladder was catheterized for clear, yellow urine. A speculum was then placed in the patient's vagina and a single tooth tenaculum was applied to the anterior lip of the cervix.   A paracervical block using 30 ml of 0.5% Marcaine was administered.  The cervix was dilated manually with metal dilators to accommodate the hydrothermal ablation hysteroscopic apparatus.  The hysteroscope was inserted under direct visualization using normal  saline as a suspension medium.  The uterine cavity was carefully examined, both ostia were recognized, and diffusely proliferative endometrium was noted.   The hydrothermal ablation was then carried out as per protocol.   Complete ablation of the endometrium was observed and the hysteroscope was removed under direct visualization.  No complications were observed.  The tenaculum was removed from the anterior lip of the cervix, and the vaginal speculum was removed after noting good hemostasis.  The patient tolerated the procedure well and was taken to the recovery area awake, extubated and in stable condition.  The patient will be discharged to home as per PACU criteria.  Routine postoperative instructions given.  She was prescribed Oxycodone, Ibuprofen and Colace.  She will follow up in the office in 3-4 weeks for postoperative evaluation.    Verita Schneiders, MD, [place] for [employer] [facility], [place]

## 2021-11-12 NOTE — Anesthesia Procedure Notes (Signed)
Procedure Name: LMA Insertion Date/Time: 11/12/2021 1:00 PM  Performed by: Georgeanne Nim, CRNAPre-anesthesia Checklist: Emergency Drugs available, Patient identified, Suction available, Patient being monitored and Timeout performed Patient Re-evaluated:Patient Re-evaluated prior to induction Oxygen Delivery Method: Circle system utilized Preoxygenation: Pre-oxygenation with 100% oxygen Induction Type: IV induction Ventilation: Mask ventilation without difficulty LMA: LMA inserted LMA Size: 4.0 Number of attempts: 1 Placement Confirmation: positive ETCO2, CO2 detector and breath sounds checked- equal and bilateral Tube secured with: Tape Dental Injury: Teeth and Oropharynx as per pre-operative assessment

## 2021-11-12 NOTE — Progress Notes (Signed)
Patient felt need to urinate but unable to void. Bladder scanned with 114 cc showing on scan. Informed that she could stay and drink liquids to fill bladder more, or go home with the understanding that she will need to void within 6 hrs of 1345 ( end of surgery time). If unable to void within the timeframe or feels full and unable to void before then to go to nearest ED. Voices understanding and chooses to go home.

## 2021-11-13 ENCOUNTER — Encounter (HOSPITAL_BASED_OUTPATIENT_CLINIC_OR_DEPARTMENT_OTHER): Payer: Self-pay | Admitting: Obstetrics & Gynecology

## 2021-11-14 ENCOUNTER — Other Ambulatory Visit (HOSPITAL_COMMUNITY): Payer: Self-pay

## 2021-11-24 ENCOUNTER — Telehealth: Payer: Self-pay

## 2021-11-24 NOTE — Telephone Encounter (Signed)
Left message for ott to call office back regarding sooner appt on 11/25/21.

## 2021-11-26 ENCOUNTER — Other Ambulatory Visit (HOSPITAL_COMMUNITY): Payer: Self-pay

## 2021-12-02 ENCOUNTER — Encounter: Payer: 59 | Admitting: Student

## 2021-12-12 ENCOUNTER — Encounter: Payer: Self-pay | Admitting: Obstetrics & Gynecology

## 2021-12-12 ENCOUNTER — Ambulatory Visit (INDEPENDENT_AMBULATORY_CARE_PROVIDER_SITE_OTHER): Payer: 59 | Admitting: Internal Medicine

## 2021-12-12 ENCOUNTER — Telehealth: Payer: Self-pay

## 2021-12-12 ENCOUNTER — Other Ambulatory Visit (HOSPITAL_COMMUNITY): Payer: Self-pay

## 2021-12-12 VITALS — BP 142/77 | HR 78 | Temp 98.4°F | Ht 67.0 in | Wt 232.7 lb

## 2021-12-12 DIAGNOSIS — R7303 Prediabetes: Secondary | ICD-10-CM

## 2021-12-12 DIAGNOSIS — Z87891 Personal history of nicotine dependence: Secondary | ICD-10-CM | POA: Diagnosis not present

## 2021-12-12 DIAGNOSIS — E119 Type 2 diabetes mellitus without complications: Secondary | ICD-10-CM

## 2021-12-12 DIAGNOSIS — Z7985 Long-term (current) use of injectable non-insulin antidiabetic drugs: Secondary | ICD-10-CM

## 2021-12-12 DIAGNOSIS — I10 Essential (primary) hypertension: Secondary | ICD-10-CM

## 2021-12-12 DIAGNOSIS — Z7984 Long term (current) use of oral hypoglycemic drugs: Secondary | ICD-10-CM | POA: Diagnosis not present

## 2021-12-12 LAB — POCT GLYCOSYLATED HEMOGLOBIN (HGB A1C): Hemoglobin A1C: 11.2 % — AB (ref 4.0–5.6)

## 2021-12-12 LAB — GLUCOSE, CAPILLARY: Glucose-Capillary: 364 mg/dL — ABNORMAL HIGH (ref 70–99)

## 2021-12-12 MED ORDER — TRULICITY 0.75 MG/0.5ML ~~LOC~~ SOAJ
0.7500 mg | SUBCUTANEOUS | 0 refills | Status: DC
  Filled 2021-12-12 (×2): qty 2, 28d supply, fill #0

## 2021-12-12 MED ORDER — OZEMPIC (0.25 OR 0.5 MG/DOSE) 2 MG/3ML ~~LOC~~ SOPN
0.2500 mg | PEN_INJECTOR | SUBCUTANEOUS | 0 refills | Status: DC
  Filled 2021-12-12: qty 3, 28d supply, fill #0
  Filled 2021-12-16 – 2021-12-17 (×2): qty 3, 56d supply, fill #0
  Filled 2021-12-18: qty 3, 28d supply, fill #0

## 2021-12-12 MED ORDER — EMPAGLIFLOZIN 10 MG PO TABS
10.0000 mg | ORAL_TABLET | Freq: Every day | ORAL | 0 refills | Status: DC
  Filled 2021-12-12: qty 30, 30d supply, fill #0

## 2021-12-12 MED ORDER — DAPAGLIFLOZIN PROPANEDIOL 5 MG PO TABS
5.0000 mg | ORAL_TABLET | Freq: Every day | ORAL | 0 refills | Status: DC
  Filled 2021-12-12 (×2): qty 30, 30d supply, fill #0

## 2021-12-12 MED ORDER — OZEMPIC (1 MG/DOSE) 4 MG/3ML ~~LOC~~ SOPN
0.2500 mg | PEN_INJECTOR | SUBCUTANEOUS | 0 refills | Status: DC
  Filled 2021-12-12: qty 3, 30d supply, fill #0
  Filled 2021-12-12: qty 3, 28d supply, fill #0

## 2021-12-12 NOTE — Telephone Encounter (Signed)
Pa for pt (  TRULICITY )  came through on cover my meds .Marland Kitchen Was submitted with last office notes and labs  awaiting approval or denial ..      UPDATE :     [employer] is reviewing your PA request. Typically an electronic response will be received within 24-72 hours

## 2021-12-12 NOTE — Progress Notes (Signed)
CC: BP follow up  HPI:  [name] is a 46 y.o. female living with a history stated below and presents today for hypertension. Please see problem based assessment and plan for additional details.  Past Medical History:  Diagnosis Date   Abnormal uterine bleeding (AUB)    Arthritis    History of seizures as a child    none since age 86 per patient   Hypertension    Iron deficiency anemia    Pre-diabetes     Current Outpatient Medications on File Prior to Visit  Medication Sig Dispense Refill   docusate sodium (COLACE) 100 MG capsule Take 1 capsule (100 mg total) by mouth 2 (two) times daily as needed for mild constipation or moderate constipation. 30 capsule 2   EPINEPHrine (EPI-PEN) 0.3 mg/0.3 mL SOAJ injection Inject 0.3 mLs (0.3 mg total) into the muscle once. (Patient not taking: Reported on 09/09/2021) 1 Device 3   ibuprofen (ADVIL) 800 MG tablet Take 1 tablet (800 mg total) by mouth 3 (three) times daily with meals as needed for headache, moderate pain, cramping or fever. 30 tablet 3   iron polysaccharides (FERREX 150) 150 MG capsule Take 1 capsule (150 mg total) by mouth every other day. 15 capsule 2   lisinopril-hydrochlorothiazide (ZESTORETIC) 10-12.5 MG tablet Take 1 tablet by mouth daily. 30 tablet 2   Multiple Vitamin (MULTIVITAMIN WITH MINERALS) TABS tablet Take 1 tablet by mouth daily.     naproxen (NAPROSYN) 500 MG tablet Take 1 tablet (500 mg total) by mouth 2 (two) times daily with a meal. As needed for pain or during heavy menstrual bleeding 30 tablet 0   oxyCODONE (OXY IR/ROXICODONE) 5 MG immediate release tablet Take 1 tablet (5 mg total) by mouth every 4 (four) hours as needed for severe pain or breakthrough pain. 10 tablet 0   tiZANidine (ZANAFLEX) 4 MG capsule Take 1 capsule (4 mg total) by mouth 3 (three) times daily. 30 capsule 0   No current facility-administered medications on file prior to visit.    Family History  Problem Relation Age of Onset    Ovarian cancer Mother        Mother died in her 57s, shorlty the pt was born    Breast cancer Cousin        diagnosed at age 43    Social History   Socioeconomic History   Marital status: Single    Spouse name: Not on file   Number of children: Not on file   Years of education: Not on file   Highest education level: Not on file  Occupational History   Not on file  Tobacco Use   Smoking status: Former    Types: Cigarettes    Quit date: 11/10/2007    Years since quitting: 14.0   Smokeless tobacco: Never  Vaping Use   Vaping Use: Never used  Substance and Sexual Activity   Alcohol use: No    Alcohol/week: 0.0 standard drinks of alcohol   Drug use: No   Sexual activity: Not on file  Other Topics Concern   Not on file  Social History Narrative   Not on file   Social Determinants of Health   Financial Resource Strain: Not on file  Food Insecurity: Not on file  Transportation Needs: Not on file  Physical Activity: Not on file  Stress: Not on file  Social Connections: Not on file  Intimate Partner Violence: Not on file    Review of Systems: ROS  negative except for what is noted on the assessment and plan.  Vitals:   12/12/21 0859 12/12/21 0932  BP: (!) 140/80 (!) 142/77  Pulse: 90 78  Temp: 98.4 F (36.9 C)   TempSrc: Oral   SpO2: 98%   Weight: 232 lb 11.2 oz (105.6 kg)   Height: '5\' 7"'$  (1.702 m)     Physical Exam: Constitutional: well-appearing female sitting in chair, in no acute distress Cardiovascular: regular rate and rhythm, no m/r/g Pulmonary/Chest: normal work of breathing on room air, lungs clear to auscultation bilaterally Abdominal: soft, non-tender, non-distended MSK: normal bulk and tone Neurological: alert & oriented x 3, no focal deficit Skin: warm and dry Psych: normal mood and behavior  Assessment & Plan:     Patient discussed with Dr. Cain Sieve  Essential hypertension BP Readings from Last 3 Encounters:  12/12/21 (!) 142/77   11/12/21 (!) 141/86  10/27/21 (!) 155/85   BP is elevated today to 142/77, however, the patient was just diagnosed with diabetes and is having a difficult time adjusting to this news. We will not make any changes to her medication regimen at this time and will follow this up at her next office visit.  Plan: - Continue lisinopril-HCTZ 10-12.5 mg daily  Type 2 diabetes mellitus ([place]) The patient had previously had an A1c of 6%, in the prediabetes range, upon her last check one year ago.  She describes that she has been feeling really thirsty and has been drinking a lot of water recently, which has been new for her over the last couple of months.  She does have a family history of diabetes and has seen many family members with complications of diabetes, such as amputations.  Her A1c today is 11.2%, and the patient is very upset to hear that she now has diabetes.  We discussed various medication regimens to help get this number under control (goal <7%).  The patient is highly motivated to get her A1c down. I have also provided the patient with information about a diabetes support group, as well as information on dietary modifications she can make. Despite being upset about this diagnosis, she remains motivated to improve her A1c.   Plan: - Start jardiance 10 mg daily (farxiga not covered) - Start Ozempic 5.40 mg/week (trulicity not covered) - Urine micro today - Follow up in 2 weeks; likely will titrate medication dosages up   Khushbu Pippen, D.O. [facility], PGY-2 Phone: [phone] Date 12/12/2021 Time 12:10 PM

## 2021-12-12 NOTE — Patient Instructions (Signed)
Thank you, [name] for allowing [place] to provide your care today. Today we discussed:  Diabetes: We are going to work together to get your diabetes under control. I know you are motivated to do this and can be successful! Start taking Trulicity 7.01 mg once a week Start taking Wilder Glade once a day (pill) High blood pressure: Keep taking one pill of lisinopril-HCTZ each day!  I have ordered the following labs for you:   Lab Orders         Glucose, capillary         Microalbumin / Creatinine Urine Ratio         POC Hbg A1C       Referrals ordered today:   Referral Orders  No referral(s) requested today     I have ordered the following medication/changed the following medications:   Stop the following medications: Medications Discontinued During This Encounter  Medication Reason   megestrol (MEGACE) 40 MG tablet    cyclobenzaprine (FLEXERIL) 10 MG tablet      Start the following medications: Meds ordered this encounter  Medications   Dulaglutide (TRULICITY) 7.79 TJ/0.3ES SOPN    Sig: Inject 0.75 mg into the skin once a week.    Dispense:  3 mL    Refill:  0   dapagliflozin propanediol (FARXIGA) 5 MG TABS tablet    Sig: Take 1 tablet (5 mg total) by mouth daily before breakfast.    Dispense:  30 tablet    Refill:  0     Follow up:  2 weeks      Should you have any questions or concerns please call the internal medicine clinic at [phone].     Buddy Duty, D.O. [place]

## 2021-12-12 NOTE — Assessment & Plan Note (Signed)
BP Readings from Last 3 Encounters:  12/12/21 (!) 142/77  11/12/21 (!) 141/86  10/27/21 (!) 155/85   BP is elevated today to 142/77, however, the patient was just diagnosed with diabetes and is having a difficult time adjusting to this news. We will not make any changes to her medication regimen at this time and will follow this up at her next office visit.  Plan: - Continue lisinopril-HCTZ 10-12.5 mg daily

## 2021-12-12 NOTE — Assessment & Plan Note (Addendum)
The patient had previously had an A1c of 6%, in the prediabetes range, upon her last check one year ago.  She describes that she has been feeling really thirsty and has been drinking a lot of water recently, which has been new for her over the last couple of months.  She does have a family history of diabetes and has seen many family members with complications of diabetes, such as amputations.  Her A1c today is 11.2%, and the patient is very upset to hear that she now has diabetes.  We discussed various medication regimens to help get this number under control (goal <7%).  The patient is highly motivated to get her A1c down. I have also provided the patient with information about a diabetes support group, as well as information on dietary modifications she can make. Despite being upset about this diagnosis, she remains motivated to improve her A1c.   Plan: - Start jardiance 10 mg daily (farxiga not covered) - Start Ozempic 1.69 mg/week (trulicity not covered) - Urine micro today - Follow up in 2 weeks; likely will titrate medication dosages up

## 2021-12-12 NOTE — Addendum Note (Signed)
Addended by: Buddy Duty on: 12/12/2021 02:54 PM   Modules accepted: Orders

## 2021-12-12 NOTE — Telephone Encounter (Signed)
DECISION :      Out come  Approved today   Request Reference Number: [id number].   TRULICITY INJ 9.52/8.4 is approved through 12/13/2022.   Your patient may now fill this prescription and it will be covered.   Drug Trulicity 0.'75MG'$ /0.5ML pen-injectors   Form [employer] Electronic Prior Authorization Form (2017 NCPDP) Original Claim Info 75 Enter ICD-10 code provided by prescriberNon-covered ICD-10 requires PAPA Required call [phone] Requires Prior Authorization    ( [place] )

## 2021-12-13 LAB — MICROALBUMIN / CREATININE URINE RATIO
Creatinine, Urine: 41.8 mg/dL
Microalb/Creat Ratio: 221 mg/g creat — ABNORMAL HIGH (ref 0–29)
Microalbumin, Urine: 92.2 ug/mL

## 2021-12-15 NOTE — Progress Notes (Signed)
Urine micro/cr ratio elevated to 221 (compared to 82 five years ago). The patient was just diagnosed with diabetes, as she was previously pre-diabetic. Will likely need to get BMP and monitor kidney function at her next visit. Called patient and made aware.

## 2021-12-15 NOTE — Progress Notes (Signed)
Internal Medicine Clinic Attending ° °Case discussed with Dr. Atway  At the time of the visit.  We reviewed the resident’s history and exam and pertinent patient test results.  I agree with the assessment, diagnosis, and plan of care documented in the resident’s note.  °

## 2021-12-16 ENCOUNTER — Other Ambulatory Visit (HOSPITAL_COMMUNITY): Payer: Self-pay

## 2021-12-16 ENCOUNTER — Telehealth: Payer: Self-pay

## 2021-12-16 NOTE — Telephone Encounter (Signed)
Request Reference Number: [id number]. OZEMPIC INJ '2MG'$ /3ML is approved through 12/17/2022. Your patient may now fill this prescription and it will be covered.

## 2021-12-16 NOTE — Telephone Encounter (Signed)
PA for patient (ozempic) came through on cover my meds was submitted with loast office notes awaiting approval or denial

## 2021-12-17 ENCOUNTER — Other Ambulatory Visit (HOSPITAL_COMMUNITY): Payer: Self-pay

## 2021-12-18 ENCOUNTER — Other Ambulatory Visit (HOSPITAL_COMMUNITY): Payer: Self-pay

## 2021-12-23 ENCOUNTER — Ambulatory Visit (INDEPENDENT_AMBULATORY_CARE_PROVIDER_SITE_OTHER): Payer: 59 | Admitting: Obstetrics & Gynecology

## 2021-12-23 ENCOUNTER — Other Ambulatory Visit (HOSPITAL_COMMUNITY): Payer: Self-pay

## 2021-12-23 ENCOUNTER — Encounter: Payer: Self-pay | Admitting: Obstetrics & Gynecology

## 2021-12-23 VITALS — BP 130/83 | HR 81 | Ht 67.0 in | Wt 228.4 lb

## 2021-12-23 DIAGNOSIS — N760 Acute vaginitis: Secondary | ICD-10-CM | POA: Diagnosis not present

## 2021-12-23 DIAGNOSIS — Z09 Encounter for follow-up examination after completed treatment for conditions other than malignant neoplasm: Secondary | ICD-10-CM

## 2021-12-23 DIAGNOSIS — Z9889 Other specified postprocedural states: Secondary | ICD-10-CM | POA: Diagnosis not present

## 2021-12-23 DIAGNOSIS — Z1211 Encounter for screening for malignant neoplasm of colon: Secondary | ICD-10-CM | POA: Diagnosis not present

## 2021-12-23 MED ORDER — FLUCONAZOLE 150 MG PO TABS
150.0000 mg | ORAL_TABLET | ORAL | 3 refills | Status: DC
  Filled 2021-12-23: qty 1, 1d supply, fill #0
  Filled 2021-12-26: qty 1, 1d supply, fill #1
  Filled 2022-01-20 – 2022-02-04 (×2): qty 1, 1d supply, fill #2
  Filled 2022-03-02: qty 1, 1d supply, fill #3

## 2021-12-23 NOTE — Progress Notes (Signed)
   GYNECOLOGY POSTOP VISIT  Subjective:     Nancy Silva is a 46 y.o. female who presents to the clinic 6 weeks status post  Hydrothermal Endometrial Ablation  for abnormal uterine bleeding. Eating a regular diet without difficulty. Bowel movements are normal. The patient is not having any pain. No bleeding for the last two weeks. Recently diagnosed with T2DM, feels she has a yeast infection and desires medication. No other concerns.   The following portions of the patient's history were reviewed and updated as appropriate: allergies, current medications, past family history, past medical history, past social history, past surgical history, and problem list.  Normal pap and negative HRHPV on 10/21/2021, normal mammogram on 05/06/2021.  Never had a colonoscopy.   Review of Systems Pertinent items noted in HPI and remainder of comprehensive ROS otherwise negative.    Objective:    BP 130/83   Pulse 81   Ht '5\' 7"'$  (1.702 m)   Wt 228 lb 6.4 oz (103.6 kg)   LMP 12/02/2021 (Approximate)   BMI 35.77 kg/m  General:  alert and no distress  Abdomen: soft, bowel sounds active, non-tender  Pelvic:   deferred     Assessment:    Doing well postoperatively.  Operative findings again reviewed.    Plan:     1. Continue any current medications and with PCP regarding her T2DM. 2. Diflucan prescribed for presumed yeast infection, told to come back for reevaluation if symptoms persisted 3. Activity restrictions: none 4. Follow up: as needed    Verita Schneiders, MD, [place], [facility] for [employer], [place]

## 2021-12-26 ENCOUNTER — Other Ambulatory Visit (HOSPITAL_COMMUNITY): Payer: Self-pay

## 2021-12-26 ENCOUNTER — Encounter: Payer: Self-pay | Admitting: Student

## 2021-12-26 ENCOUNTER — Ambulatory Visit (INDEPENDENT_AMBULATORY_CARE_PROVIDER_SITE_OTHER): Payer: 59 | Admitting: Student

## 2021-12-26 VITALS — BP 136/74 | HR 83 | Temp 98.1°F | Ht 67.0 in | Wt 227.8 lb

## 2021-12-26 DIAGNOSIS — Z0000 Encounter for general adult medical examination without abnormal findings: Secondary | ICD-10-CM

## 2021-12-26 DIAGNOSIS — E119 Type 2 diabetes mellitus without complications: Secondary | ICD-10-CM

## 2021-12-26 DIAGNOSIS — D500 Iron deficiency anemia secondary to blood loss (chronic): Secondary | ICD-10-CM | POA: Diagnosis not present

## 2021-12-26 DIAGNOSIS — Z794 Long term (current) use of insulin: Secondary | ICD-10-CM

## 2021-12-26 DIAGNOSIS — I10 Essential (primary) hypertension: Secondary | ICD-10-CM

## 2021-12-26 DIAGNOSIS — Z87891 Personal history of nicotine dependence: Secondary | ICD-10-CM | POA: Diagnosis not present

## 2021-12-26 MED ORDER — EMPAGLIFLOZIN 25 MG PO TABS
25.0000 mg | ORAL_TABLET | Freq: Every day | ORAL | 2 refills | Status: DC
  Filled 2021-12-26: qty 30, 30d supply, fill #0
  Filled 2022-01-20 – 2022-02-04 (×2): qty 30, 30d supply, fill #1
  Filled 2022-03-02: qty 30, 30d supply, fill #2

## 2021-12-26 MED ORDER — LISINOPRIL-HYDROCHLOROTHIAZIDE 20-12.5 MG PO TABS
1.0000 | ORAL_TABLET | Freq: Every day | ORAL | 2 refills | Status: DC
  Filled 2021-12-26: qty 30, 30d supply, fill #0
  Filled 2022-01-20 – 2022-02-04 (×2): qty 30, 30d supply, fill #1
  Filled 2022-03-02: qty 30, 30d supply, fill #2

## 2021-12-26 NOTE — Assessment & Plan Note (Addendum)
Hemoglobin A1c at last visit was 11.2.  Seems to be coping well with new diagnosis of diabetes.  Has been taking her empagliflozin as prescribed.  Has some trouble injecting her semaglutide.  Asked about utility of home CBG monitoring.  Do not recommend at this time since she is not on any medicines with risk of hypoglycemia.  Explained it is sufficient to follow-up hemoglobin A1c at her next visit.  Reinforced healthy lifestyle choices like limiting sugar and carbohydrate in her diet.  Patient was given a demonstration of proper semaglutide injection procedure.  Observed her administer her first dose today in the clinic.  I will increase her empagliflozin today.  I will follow-up with her via phone call in 2 weeks to see how she is tolerating the semaglutide.  If she is doing well I will increase the dose to 0.5 mg weekly - Diabetic foot exam performed today was normal. - Increase empagliflozin from 10 mg to 25 mg daily - Follow-up by phone in 2 weeks regarding increase in semaglutide from 0.25 to 0.5 mg weekly - Repeat hemoglobin A1c at next clinic visit - Referral to ophthalmology placed today.  ADDENDUM: Spoke to patient over the phone.  She is tolerating the 0.25 mg of Ozempic well.  She did mention that she had a headache, and she was evaluated in the clinic for that.  It was thought to be due to migraine or perhaps tension type headache.  I reviewed Ozempic adverse effects and up to 71% of people may suffer headache as a side effect.  However she denies GI upset.  We discussed increasing the dose, and the patient feels like she can tolerate a dose increase at this time.  I will increase her Ozempic to 0.5 mg weekly to begin week of 01/12/2022. - Increase Ozempic 2.5 mg subcutaneous weekly.

## 2021-12-26 NOTE — Progress Notes (Addendum)
Subjective:  CC: 2-week follow-up for diabetes  HPI:  [name] is a 46 y.o. female with a past medical history stated below and presents today for 2-week follow-up for diabetes. Please see problem based assessment and plan for additional details.  Doing well overall.  Tolerating her medicines well.  Did have an issue with her semaglutide.  Feels like she may have administered the dose incorrectly, thus causing her not to receive the medicine subcutaneously.  Spends her days working for [employer].  Acknowledges this is a pretty sedentary job.  Lives in [place] at home with her son, 28 years old, and her 2 sisters.  Past Medical History:  Diagnosis Date   Abnormal uterine bleeding (AUB)    Arthritis    History of seizures as a child    none since age 46 per patient   Hypertension    Iron deficiency anemia    Menorrhagia with irregular cycle 11/06/2016   Pre-diabetes     Current Outpatient Medications on File Prior to Visit  Medication Sig Dispense Refill   docusate sodium (COLACE) 100 MG capsule Take 1 capsule (100 mg total) by mouth 2 (two) times daily as needed for mild constipation or moderate constipation. (Patient not taking: Reported on 12/23/2021) 30 capsule 2   EPINEPHrine (EPI-PEN) 0.3 mg/0.3 mL SOAJ injection Inject 0.3 mLs (0.3 mg total) into the muscle once. (Patient not taking: Reported on 09/09/2021) 1 Device 3   fluconazole (DIFLUCAN) 150 MG tablet Take 1 tablet (150 mg total) by mouth. Can take additional dose three days later if symptoms persist 1 tablet 3   ibuprofen (ADVIL) 800 MG tablet Take 1 tablet (800 mg total) by mouth 3 (three) times daily with meals as needed for headache, moderate pain, cramping or fever. (Patient not taking: Reported on 12/23/2021) 30 tablet 3   iron polysaccharides (FERREX 150) 150 MG capsule Take 1 capsule (150 mg total) by mouth every other day. 15 capsule 2   Multiple Vitamin (MULTIVITAMIN WITH  MINERALS) TABS tablet Take 1 tablet by mouth daily.     naproxen (NAPROSYN) 500 MG tablet Take 1 tablet (500 mg total) by mouth 2 (two) times daily with a meal. As needed for pain or during heavy menstrual bleeding (Patient not taking: Reported on 12/23/2021) 30 tablet 0   Semaglutide,0.25 or 0.'5MG'$ /DOS, (OZEMPIC, 0.25 OR 0.5 MG/DOSE,) 2 MG/3ML SOPN Inject 0.25 mg into the skin once a week. 3 mL 0   tiZANidine (ZANAFLEX) 4 MG capsule Take 1 capsule (4 mg total) by mouth 3 (three) times daily. (Patient not taking: Reported on 12/23/2021) 30 capsule 0   No current facility-administered medications on file prior to visit.    Family History  Problem Relation Age of Onset   Ovarian cancer Mother        Mother died in her 88s, shorlty the pt was born    Breast cancer Cousin        diagnosed at age 6    Social History   Socioeconomic History   Marital status: Single    Spouse name: Not on file   Number of children: Not on file   Years of education: Not on file   Highest education level: Not on file  Occupational History   Not on file  Tobacco Use   Smoking status: Former    Types: Cigarettes    Quit date: 11/10/2007    Years since quitting: 14.1    Passive  exposure: Past   Smokeless tobacco: Never  Vaping Use   Vaping Use: Never used  Substance and Sexual Activity   Alcohol use: No    Alcohol/week: 0.0 standard drinks of alcohol   Drug use: No   Sexual activity: Yes    Birth control/protection: Surgical  Other Topics Concern   Not on file  Social History Narrative   Not on file   Social Determinants of Health   Financial Resource Strain: Not on file  Food Insecurity: Not on file  Transportation Needs: Not on file  Physical Activity: Not on file  Stress: Not on file  Social Connections: Not on file  Intimate Partner Violence: Not on file    Review of Systems: ROS negative except for what is noted on the assessment and plan.  Objective:   Vitals:   12/26/21 0837   BP: 136/74  Pulse: 83  Temp: 98.1 F (36.7 C)  TempSrc: Oral  SpO2: 100%  Weight: 227 lb 12.8 oz (103.3 kg)  Height: '5\' 7"'$  (1.702 m)    Physical Exam: Constitutional: Well-appearing female sitting in chair in exam room. Neck: Supple.  No cervical lymphadenopathy. Cardiovascular: Regular rate and rhythm.  Radial pulses 2+ bilaterally. Pulmonary/Chest: Normal work of breathing.  Clear to auscultation bilaterally. Abdominal: Soft nontender. Neurological: Alert and oriented. Skin: Warm and dry. Psych: Pleasant.  Appropriate mood and affect.   Assessment & Plan:  Essential hypertension Currently taking lisinopril/HCTZ 10/12.5 mg daily.  12/12/2021 blood pressure 142/77.  829 blood pressure 130/83.  Today's blood pressure 136/74.  Discussed benefits of more aggressive ASCVD risk factor modification including increased therapy for blood pressure in the setting of this patient's new diagnosis of diabetes.  Patient is amenable to increasing her blood pressure medicine at this time. - Increase lisinopril/HCTZ from 10/12.5 mg to 20/12.5 mg daily  Type 2 diabetes mellitus (HCC) Hemoglobin A1c at last visit was 11.2.  Seems to be coping well with new diagnosis of diabetes.  Has been taking her empagliflozin as prescribed.  Has some trouble injecting her semaglutide.  Asked about utility of home CBG monitoring.  Do not recommend at this time since she is not on any medicines with risk of hypoglycemia.  Explained it is sufficient to follow-up hemoglobin A1c at her next visit.  Reinforced healthy lifestyle choices like limiting sugar and carbohydrate in her diet.  Patient was given a demonstration of proper semaglutide injection procedure.  Observed her administer her first dose today in the clinic.  I will increase her empagliflozin today.  I will follow-up with her via phone call in 2 weeks to see how she is tolerating the semaglutide.  If she is doing well I will increase the dose to 0.5 mg weekly -  Diabetic foot exam performed today was normal. - Increase empagliflozin from 10 mg to 25 mg daily - Follow-up by phone in 2 weeks regarding increase in semaglutide from 0.25 to 0.5 mg weekly - Repeat hemoglobin A1c at next clinic visit - Referral to ophthalmology placed today.  Iron deficiency anemia Doing well symptomatically.  Less abnormal uterine bleeding since her endometrial ablation in July.  Last hemoglobin was 14.6 after the procedure.  Healthcare maintenance Outpatient referral to GI for colonoscopy ordered at last OB/GYN visit.  Follow-up lipid panel.    Patient seen with Dr. Teofilo Pod, M.D. [facility]  PGY-1 Pager: [phone] Date 12/26/2021  Time 10:11 AM

## 2021-12-26 NOTE — Assessment & Plan Note (Signed)
Doing well symptomatically.  Less abnormal uterine bleeding since her endometrial ablation in July.  Last hemoglobin was 14.6 after the procedure.

## 2021-12-26 NOTE — Assessment & Plan Note (Signed)
Currently taking lisinopril/HCTZ 10/12.5 mg daily.  12/12/2021 blood pressure 142/77.  829 blood pressure 130/83.  Today's blood pressure 136/74.  Discussed benefits of more aggressive ASCVD risk factor modification including increased therapy for blood pressure in the setting of this patient's new diagnosis of diabetes.  Patient is amenable to increasing her blood pressure medicine at this time. - Increase lisinopril/HCTZ from 10/12.5 mg to 20/12.5 mg daily

## 2021-12-26 NOTE — Patient Instructions (Signed)
Thank you, [name] for allowing [place] to provide your care today. Today we discussed diabetes, hypertension, and reducing your risk of cardiovascular disease.    For your diabetes continue taking semaglutide (Ozempic) as prescribed, using the injection methods you learned in the clinic today.  I will increase your empagliflozin (Jardiance) from 10 mg daily to 25 mg daily.  I have sent a referral to the eye doctors for your first eye exam to screen for diabetic eye disease.  For your high blood pressure I will increase your lisinopril/HCTZ (Zestoretic).  I have sent your new medicine dosages to the pharmacy at [facility].  I have ordered the following labs for you:   Lab Orders         Lipid Profile      Referrals ordered today:    Referral Orders         Ambulatory referral to Ophthalmology      I have ordered the following medication/changed the following medications:   Start the following medications: Meds ordered this encounter  Medications   empagliflozin (JARDIANCE) 25 MG TABS tablet    Sig: Take 1 tablet (25 mg total) by mouth daily before breakfast.    Dispense:  30 tablet    Refill:  2   lisinopril-hydrochlorothiazide (ZESTORETIC) 20-12.5 MG tablet    Sig: Take 1 tablet by mouth daily.    Dispense:  30 tablet    Refill:  2     Follow up: 2 months   We look forward to seeing you next time. Please call our clinic at [phone] if you have any questions or concerns. The best time to call is Monday-Friday from 9am-4pm, but there is someone available 24/7. If after hours or the weekend, call the main hospital number and ask for the Internal Medicine Resident On-Call. If you need medication refills, please notify your pharmacy one week in advance and they will send [place] a request.   Thank you for trusting me with your care. Wishing you the best!   Nani Gasser, MD [place]

## 2021-12-26 NOTE — Assessment & Plan Note (Addendum)
Outpatient referral to GI for colonoscopy ordered at last OB/GYN visit.  Follow-up lipid panel.

## 2021-12-27 LAB — LIPID PANEL
Chol/HDL Ratio: 3.8 ratio (ref 0.0–4.4)
Cholesterol, Total: 177 mg/dL (ref 100–199)
HDL: 46 mg/dL (ref >39)
LDL Chol Calc (NIH): 108 mg/dL — ABNORMAL HIGH (ref 0–99)
Triglycerides: 127 mg/dL (ref 0–149)
VLDL Cholesterol Cal: 23 mg/dL (ref 5–40)

## 2021-12-30 NOTE — Progress Notes (Signed)
Internal Medicine Clinic Attending  I saw and evaluated the patient.  I personally confirmed the key portions of the history and exam documented by Dr. McLendon and I reviewed pertinent patient test results.  The assessment, diagnosis, and plan were formulated together and I agree with the documentation in the resident's note.  

## 2022-01-05 ENCOUNTER — Ambulatory Visit (INDEPENDENT_AMBULATORY_CARE_PROVIDER_SITE_OTHER): Payer: 59

## 2022-01-05 ENCOUNTER — Other Ambulatory Visit: Payer: Self-pay

## 2022-01-05 VITALS — BP 122/89 | HR 72 | Temp 98.1°F | Ht 67.0 in | Wt 227.5 lb

## 2022-01-05 DIAGNOSIS — R519 Headache, unspecified: Secondary | ICD-10-CM | POA: Insufficient documentation

## 2022-01-05 DIAGNOSIS — I10 Essential (primary) hypertension: Secondary | ICD-10-CM

## 2022-01-05 NOTE — Patient Instructions (Signed)
[name], it was a pleasure seeing you today! You endorsed feeling well today. Below are some of the things we talked about this visit. We look forward to seeing you in the follow up appointment!  Today we discussed: Headache: you can take 2 500 mg tablets of tylenol every 6 hours as well as one tablet of naproxen in the morning and one in the evening for headache. If this isn't helping by the end of the week, give [place] a call.  I have ordered the following labs today:  Lab Orders  No laboratory test(s) ordered today      Referrals ordered today:   Referral Orders  No referral(s) requested today     I have ordered the following medication/changed the following medications:   Stop the following medications: There are no discontinued medications.   Start the following medications: No orders of the defined types were placed in this encounter.    Follow-up:  none needed    Please make sure to arrive 15 minutes prior to your next appointment. If you arrive late, you may be asked to reschedule.   We look forward to seeing you next time. Please call our clinic at [phone] if you have any questions or concerns. The best time to call is Monday-Friday from 9am-4pm, but there is someone available 24/7. If after hours or the weekend, call the main hospital number and ask for the Internal Medicine Resident On-Call. If you need medication refills, please notify your pharmacy one week in advance and they will send [place] a request.  Thank you for letting us take part in your care. Wishing you the best!  Thank you, Linus Galas MD

## 2022-01-05 NOTE — Assessment & Plan Note (Addendum)
Patient has had a persistent, worsening headache since last Thursday.  It is located on her left side and feels like pressure, sometimes behind her eyes.  She had no preceding head trauma or aura.  No photosensitivity, changes in vision, tearing, changes in hearing, ear pain, nausea vomiting, infectious symptoms, chest pain, or other new neurological symptoms including numbness, weakness anywhere.  She has tried 500 mg of acetaminophen once a day a couple times and this helps temporarily, but has not resolved her symptoms.  She has no history of migraines, does have a history of sinus headaches and allergies, but states that this does not feel like those.  She did have an increase in her lisinopril dose recently, and blood pressure at clinic today is initially 140/76 and then 122/89 on recheck, which is lower than most of her previous visits.  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  She does say that her vision prescription might need to be updated.  Cardiac, respiratory exam are unremarkable.  No focal deficits on neurological exam, PERRL, and EOMs are intact.  No significant tenderness to palpation over the ipsilateral shoulder muscles, neck, or scalp.  No abnormalities on funduscopic exam.  Headache has some properties of both tension and migraine components.  If this is a migraine, it would be a new diagnosis for her.  It might be possible that increasing her blood pressure medication and corresponding decrease in average blood pressure is attributing somewhat to headache, though I do not believe this is the primary etiology.  Low suspicion for giant cell arteritis, IIH, hypertensive urgency, or SAH at this time.  - Counseled on increasing Tylenol dose and adding naproxen.  She can take 1 g of Tylenol to 3 times daily and naproxen 500 mg at morning and in the evening.  Also wrote work note for this afternoon.  If this does not resolve her symptoms, advised her to call the  clinic by the end of the week and we will will consider other etiologies and further diagnostic work-up at follow-up.

## 2022-01-05 NOTE — Progress Notes (Signed)
CC: Headache  HPI:  [name] is a 46 y.o.-year-old female with past medical history as below presenting for headache.  Please see encounters tab for problem-based charting.  Past Medical History:  Diagnosis Date   Abnormal uterine bleeding (AUB)    Arthritis    History of seizures as a child    none since age 67 per patient   Hypertension    Iron deficiency anemia    Menorrhagia with irregular cycle 11/06/2016   Pre-diabetes    Review of Systems: As in HPI.  Please see encounters tab for problem based charting.  Physical Exam:  Vitals:   01/05/22 0947 01/05/22 1055  BP: (!) 140/76 122/89  Pulse: 94 72  Temp: 98.1 F (36.7 C)   TempSrc: Oral   SpO2: 99%   Weight: 227 lb 8 oz (103.2 kg)   Height: '5\' 7"'$  (1.702 m)    General:Well-appearing, pleasant, In NAD Cardiac: RRR, no murmurs rubs or gallops. Respiratory: Normal work of breathing on room air, CTAB Abdominal: Soft, nontender, nondistended Neuro: 5/5 strength in all extremities and no sensory deficits.  EOMs intact.  No abnormalities on funduscopic exam. MSK: No tenderness to palpation over left scalp, neck, or shoulder.  Assessment & Plan:   Headache Headache since last Thursday. Keeping her awake. Constant pressure/pain on left side behind eyes. Photosensitivity. Tried Tylenol, but not helping. No history of headaches. Started taking lisinopril higher dose 10 days. No vision changes, no tears, no runny nose, no hearing changes, no cough or fevers, no chest pain. Does have sinus and allergy issues but doesn't feel like that. Has not tried naproxen yet. No aura.   Patient has had a persistent, worsening headache since last Thursday.  It is located on her left side and feels like pressure, sometimes behind her eyes.  She had no preceding head trauma or aura.  No photosensitivity, changes in vision, tearing, changes in hearing, ear pain, nausea vomiting, infectious symptoms, chest pain, or other new  neurological symptoms including numbness, weakness anywhere.  She has tried 500 mg of acetaminophen once a day a couple times and this helps temporarily, but has not resolved her symptoms.  She has no history of migraines, does have a history of sinus headaches and allergies, but states that this does not feel like those.  She did have an increase in her lisinopril dose recently, and blood pressure at clinic today is initially 140/76 and then 122/89 on recheck, which is lower than most of her previous visits.  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  She does say that her vision prescription might need to be updated.  Cardiac, respiratory exam are unremarkable.  No focal deficits on neurological exam, PERRL, and EOMs are intact.  No significant tenderness to palpation over the ipsilateral shoulder muscles, neck, or scalp.  No abnormalities on funduscopic exam.  Headache has some properties of both tension and migraine components.  If this is a migraine, it would be a new diagnosis for her.  It might be possible that increasing her blood pressure medication and corresponding decrease in average blood pressure is attributing somewhat to headache, though I do not believe this is the primary etiology.  Low suspicion for giant cell arteritis, IIH, hypertensive urgency at this time. - Counseled on increasing Tylenol dose and adding naproxen.  She can take 1 g of Tylenol to 3 times daily and naproxen 500 mg at morning and in the  evening.  Also wrote work note for this afternoon.  If this does not resolve her symptoms, advised her to call the clinic by the end of the week and we will will consider other etiologies and further diagnostic work-up at follow-up.   Patient seen with Dr. Jimmye Norman

## 2022-01-08 IMAGING — CT CT HEAD W/O CM
4 series · 17 of 47 positions shown, 19 images · non-contrast
Comparison: None.

CLINICAL DATA: 44-year-old female with headache, hypertensive.

EXAM:
CT HEAD WITHOUT CONTRAST
TECHNIQUE: Contiguous axial images were obtained from the base of the skull
through the vertex without intravenous contrast.

[Series 3: head wo · axial · 0.48mm/px · z∈[+1232,+1366]mm · 7 of 37 slices shown, 9 images]
[im 5/37  brain]
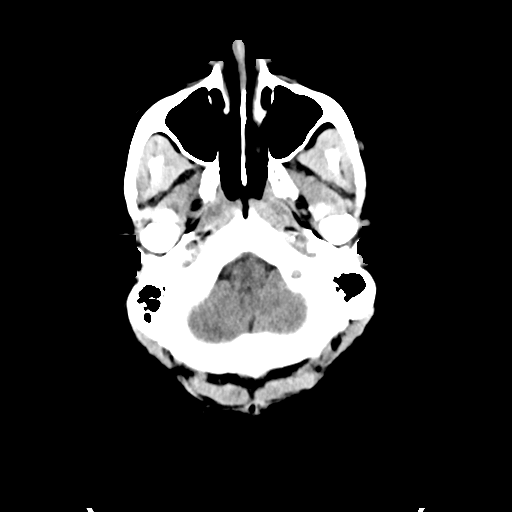
[im 5/37  bone]
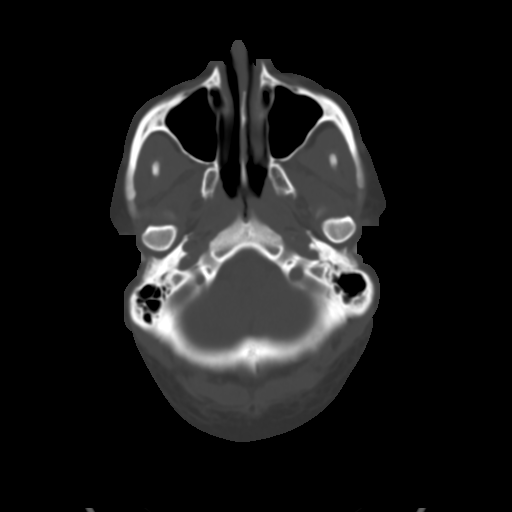
[im 10/37  brain]
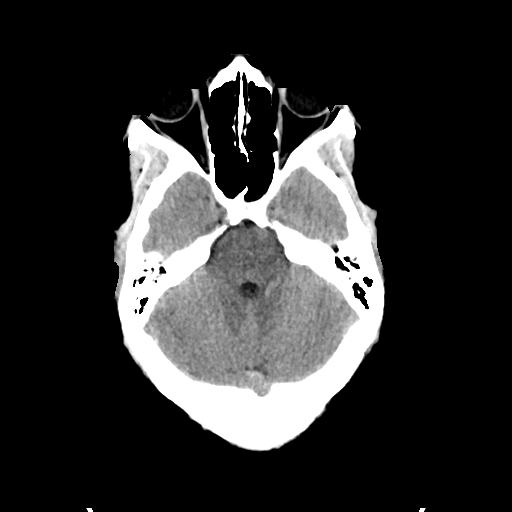
[im 14/37  brain]
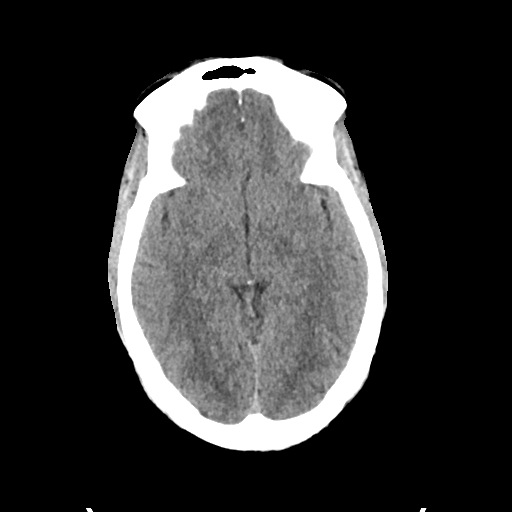
[im 19/37  brain]
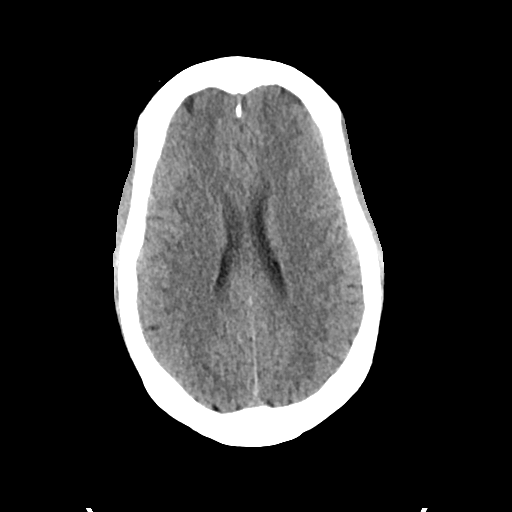
[im 23/37  brain]
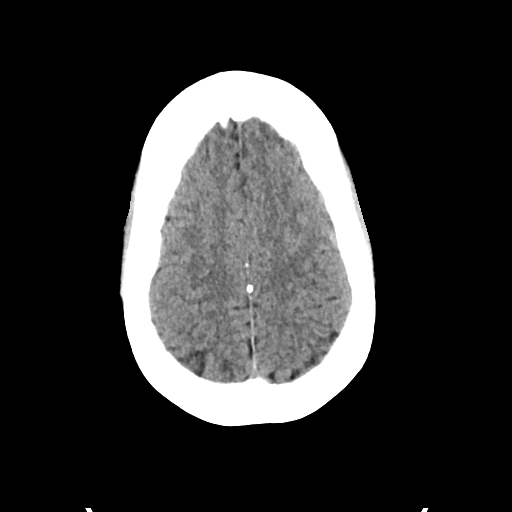
[im 23/37  bone]
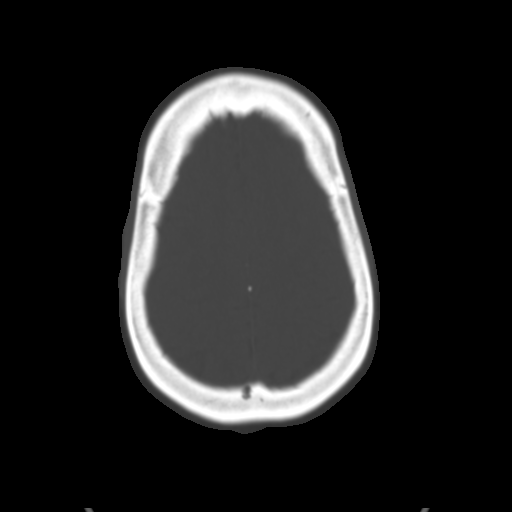
[im 28/37  brain]
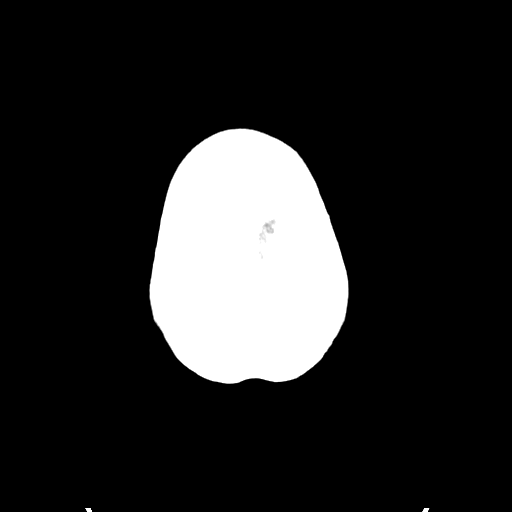
[im 32/37  brain]
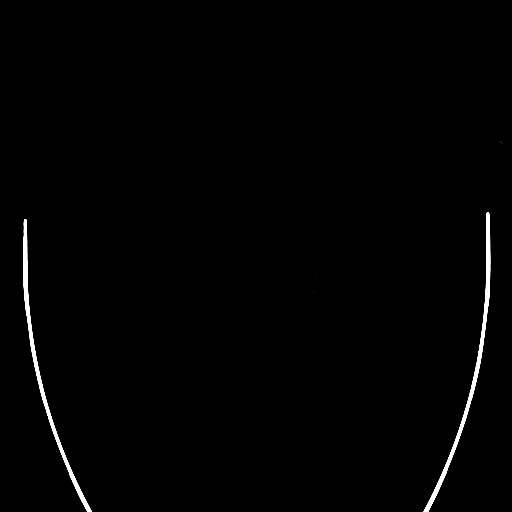

[Series 4: head bone · axial · 0.48mm/px · z∈[+1230,+1294]mm · 4 of 93 slices shown]
[im 10/93  bone]
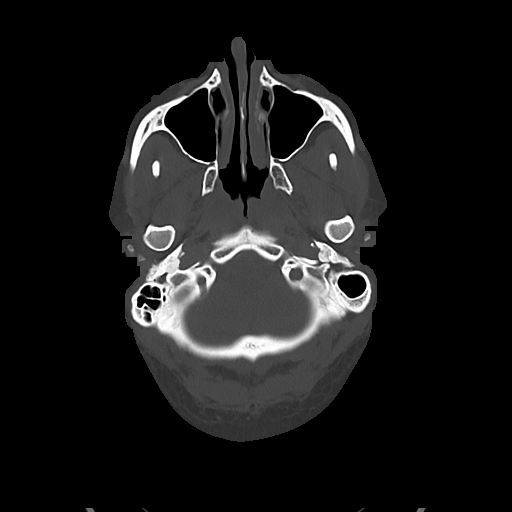
[im 19/93  bone]
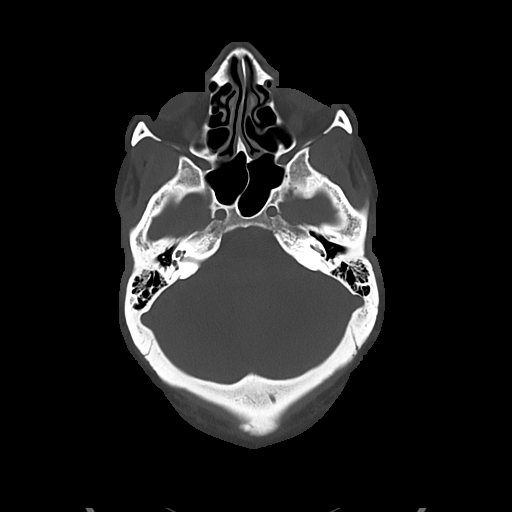
[im 28/93  bone]
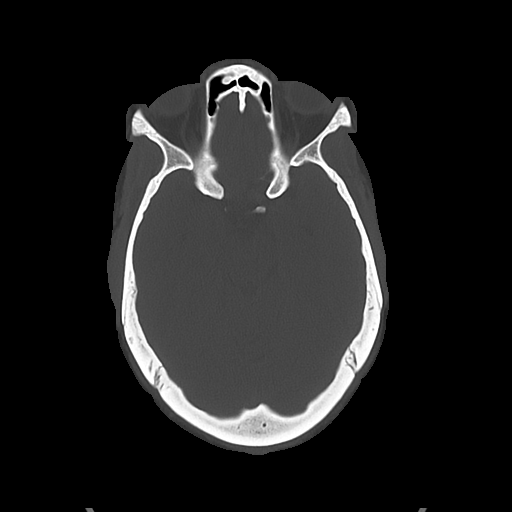
[im 42/93  bone]
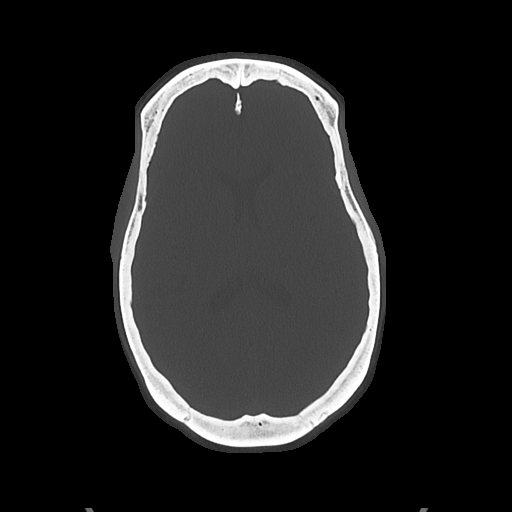

[Series 5: cor soft · coronal · 0.34mm/px · 3 of 74 slices shown]
[im 25/74  brain]
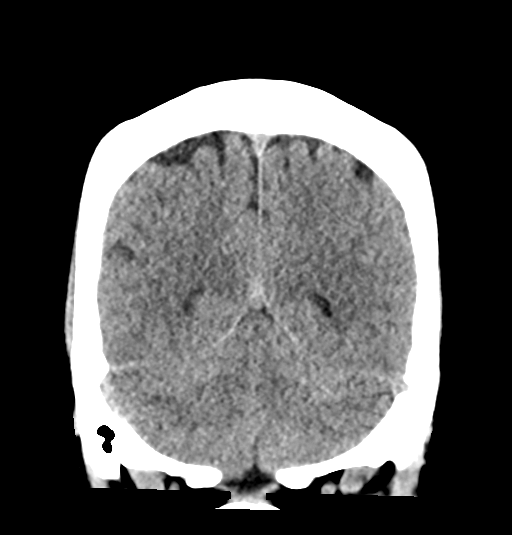
[im 33/74  brain]
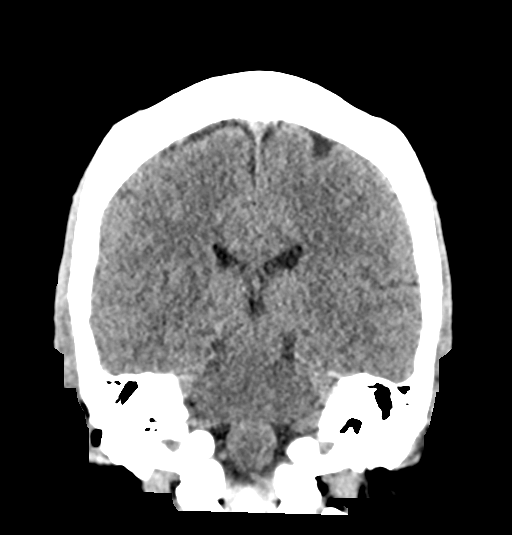
[im 41/74  brain]
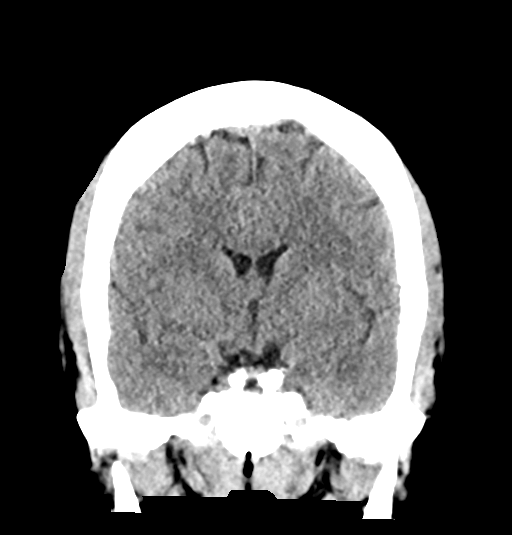

[Series 6: sag soft · sagittal · 0.36mm/px · 3 of 59 slices shown]
[im 20/59  brain]
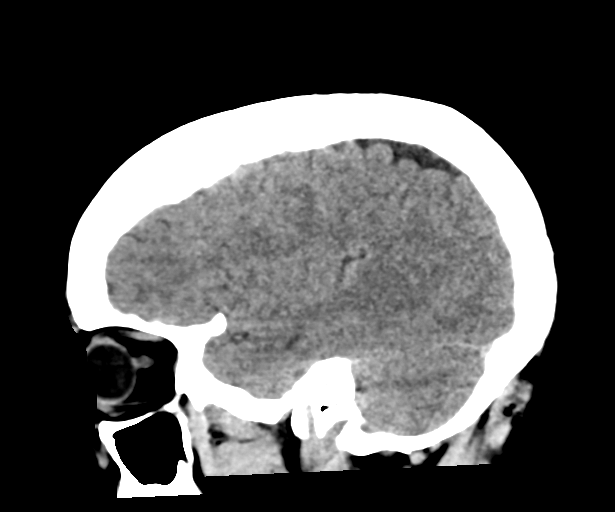
[im 30/59  brain]
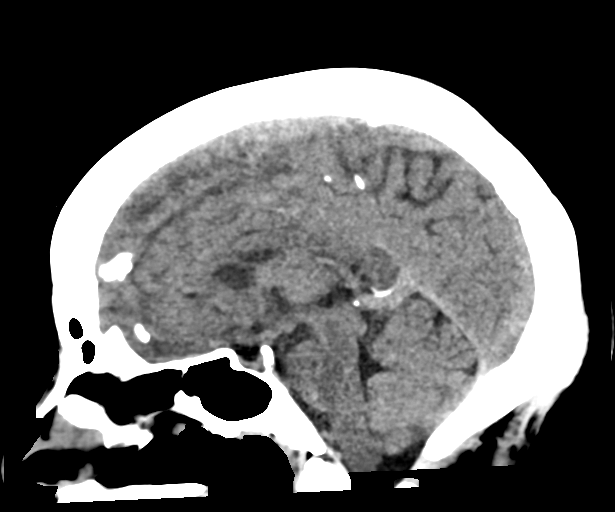
[im 39/59  brain]
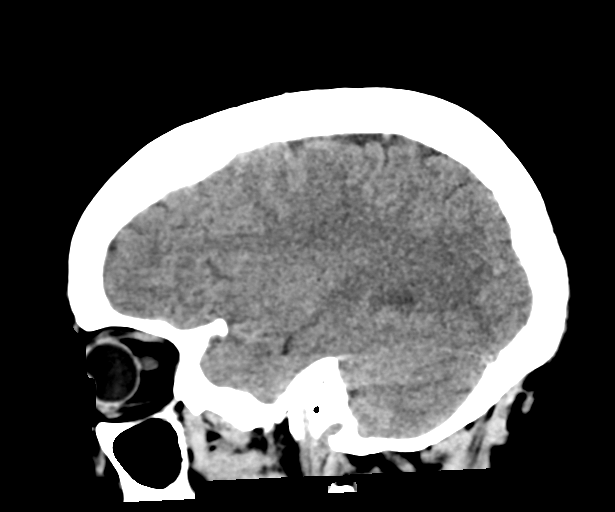

[17 of 47 positions shown; findings below may reference images not displayed]

FINDINGS: Brain: Mild scaphocephaly. Cerebral volume is within normal limits.
No midline shift, ventriculomegaly, mass effect, evidence of mass
lesion, intracranial hemorrhage or evidence of cortically based
acute infarction. Gray-white matter differentiation is within normal
limits throughout the brain.

Vascular: No suspicious intracranial vascular hyperdensity.

Skull: Hyperostosis, normal variant. No acute osseous abnormality
identified.

Sinuses/Orbits: Visualized paranasal sinuses and mastoids are clear.

Other: No acute orbit or scalp soft tissue findings.
IMPRESSION: Normal noncontrast CT appearance of the brain.

## 2022-01-09 ENCOUNTER — Other Ambulatory Visit (HOSPITAL_COMMUNITY): Payer: Self-pay

## 2022-01-09 MED ORDER — OZEMPIC (0.25 OR 0.5 MG/DOSE) 2 MG/3ML ~~LOC~~ SOPN
0.5000 mg | PEN_INJECTOR | SUBCUTANEOUS | 2 refills | Status: DC
  Filled 2022-01-09 – 2022-02-04 (×3): qty 3, 28d supply, fill #0

## 2022-01-09 NOTE — Progress Notes (Signed)
Internal Medicine Clinic Attending  I saw and evaluated the patient.  I personally confirmed the key portions of the history and exam documented by Dr. Sridharan and I reviewed pertinent patient test results.  The assessment, diagnosis, and plan were formulated together and I agree with the documentation in the resident's note.  

## 2022-01-09 NOTE — Addendum Note (Signed)
Addended by: Nani Gasser on: 01/09/2022 04:42 PM   Modules accepted: Orders

## 2022-01-15 ENCOUNTER — Other Ambulatory Visit (HOSPITAL_COMMUNITY): Payer: Self-pay

## 2022-01-20 ENCOUNTER — Other Ambulatory Visit: Payer: Self-pay | Admitting: Obstetrics & Gynecology

## 2022-01-20 ENCOUNTER — Other Ambulatory Visit: Payer: Self-pay | Admitting: Student

## 2022-01-20 ENCOUNTER — Other Ambulatory Visit (HOSPITAL_COMMUNITY): Payer: Self-pay

## 2022-01-20 DIAGNOSIS — D500 Iron deficiency anemia secondary to blood loss (chronic): Secondary | ICD-10-CM

## 2022-01-20 DIAGNOSIS — N921 Excessive and frequent menstruation with irregular cycle: Secondary | ICD-10-CM

## 2022-01-21 ENCOUNTER — Other Ambulatory Visit (HOSPITAL_COMMUNITY): Payer: Self-pay

## 2022-01-21 MED ORDER — NAPROXEN 500 MG PO TABS
500.0000 mg | ORAL_TABLET | Freq: Two times a day (BID) | ORAL | 5 refills | Status: DC
  Filled 2022-01-21 – 2022-02-04 (×2): qty 30, 15d supply, fill #0
  Filled 2022-05-29 – 2022-06-11 (×2): qty 30, 15d supply, fill #1
  Filled 2022-10-05: qty 30, 15d supply, fill #2
  Filled 2022-12-10: qty 30, 15d supply, fill #3
  Filled 2023-01-08: qty 30, 15d supply, fill #4

## 2022-01-21 MED ORDER — POLYSACCHARIDE IRON COMPLEX 150 MG PO CAPS
150.0000 mg | ORAL_CAPSULE | ORAL | 2 refills | Status: DC
  Filled 2022-01-21 – 2022-02-04 (×2): qty 15, 30d supply, fill #0
  Filled 2022-03-02: qty 15, 30d supply, fill #1
  Filled 2022-05-01: qty 15, 30d supply, fill #2

## 2022-01-30 ENCOUNTER — Other Ambulatory Visit (HOSPITAL_COMMUNITY): Payer: Self-pay

## 2022-02-05 ENCOUNTER — Other Ambulatory Visit (HOSPITAL_COMMUNITY): Payer: Self-pay

## 2022-02-25 ENCOUNTER — Ambulatory Visit (INDEPENDENT_AMBULATORY_CARE_PROVIDER_SITE_OTHER): Payer: 59 | Admitting: Student

## 2022-02-25 ENCOUNTER — Encounter: Payer: Self-pay | Admitting: Student

## 2022-02-25 ENCOUNTER — Other Ambulatory Visit (HOSPITAL_COMMUNITY): Payer: Self-pay

## 2022-02-25 VITALS — BP 114/62 | HR 91 | Temp 97.4°F | Wt 220.5 lb

## 2022-02-25 DIAGNOSIS — D500 Iron deficiency anemia secondary to blood loss (chronic): Secondary | ICD-10-CM

## 2022-02-25 DIAGNOSIS — Z87891 Personal history of nicotine dependence: Secondary | ICD-10-CM

## 2022-02-25 DIAGNOSIS — E119 Type 2 diabetes mellitus without complications: Secondary | ICD-10-CM

## 2022-02-25 DIAGNOSIS — Z7984 Long term (current) use of oral hypoglycemic drugs: Secondary | ICD-10-CM | POA: Diagnosis not present

## 2022-02-25 DIAGNOSIS — Z0000 Encounter for general adult medical examination without abnormal findings: Secondary | ICD-10-CM

## 2022-02-25 DIAGNOSIS — I10 Essential (primary) hypertension: Secondary | ICD-10-CM

## 2022-02-25 DIAGNOSIS — Z7985 Long-term (current) use of injectable non-insulin antidiabetic drugs: Secondary | ICD-10-CM

## 2022-02-25 LAB — POCT GLYCOSYLATED HEMOGLOBIN (HGB A1C): Hemoglobin A1C: 7.4 % — AB (ref 4.0–5.6)

## 2022-02-25 LAB — GLUCOSE, CAPILLARY: Glucose-Capillary: 91 mg/dL (ref 70–99)

## 2022-02-25 MED ORDER — SEMAGLUTIDE (1 MG/DOSE) 4 MG/3ML ~~LOC~~ SOPN
1.0000 mg | PEN_INJECTOR | SUBCUTANEOUS | 2 refills | Status: DC
  Filled 2022-02-25 – 2022-03-06 (×2): qty 3, 28d supply, fill #0
  Filled 2022-03-29: qty 3, 28d supply, fill #1
  Filled 2022-05-01: qty 3, 28d supply, fill #2

## 2022-02-25 NOTE — Progress Notes (Signed)
   CC: Follow-up on A1c  HPI:  [name] is a 46 y.o. with past medical history of hypertension, type 2 diabetes, iron deficiency anemia who presents to clinic for A1c recheck.  Please see problem based charting for detailed  Past Medical History:  Diagnosis Date   Abnormal uterine bleeding (AUB)    Arthritis    History of seizures as a child    none since age 44 per patient   Hypertension    Iron deficiency anemia    Menorrhagia with irregular cycle 11/06/2016   Pre-diabetes    Review of Systems: Per HPI  Physical Exam:  Vitals:   02/25/22 0850  BP: 114/62  Pulse: 91  Temp: (!) 97.4 F (36.3 C)  TempSrc: Oral  SpO2: 99%  Weight: 220 lb 8 oz (100 kg)   Physical Exam Constitutional:      General: She is not in acute distress.    Appearance: She is not ill-appearing.  HENT:     Head: Normocephalic.  Eyes:     General:        Right eye: No discharge.        Left eye: No discharge.     Conjunctiva/sclera: Conjunctivae normal.  Cardiovascular:     Rate and Rhythm: Normal rate and regular rhythm.  Pulmonary:     Effort: Pulmonary effort is normal. No respiratory distress.     Breath sounds: Normal breath sounds. No wheezing.  Abdominal:     General: Bowel sounds are normal. There is no distension.     Palpations: Abdomen is soft.     Tenderness: There is no abdominal tenderness.  Musculoskeletal:        General: Normal range of motion.     Cervical back: Normal range of motion.  Skin:    General: Skin is warm.  Neurological:     Mental Status: She is alert.  Psychiatric:        Mood and Affect: Mood normal.      Assessment & Plan:   See Encounters Tab for problem based charting.  Essential hypertension Blood pressure well controlled 114/62.  Her lisinopril-HCTZ was increased to 20-12.5 mg last month.  -Continue current regimen -Recheck BMP  Type 2 diabetes mellitus ([place]) She had an significant improvement in her A1c from 11.2 to 7.4.   She is currently taking Jardiance 25 mg and Ozempic 0.5 mg weekly.  She denies any GI side effects from the current dose of Ozempic.  She lost 7 pounds since last visit.  Patient is spending time for exercising and watch her diet.  Her microalbuminuria is elevated at 221.  She is already on an ACE and SGLT2.  -Continue Jardiance 25 mg -Increase Ozempic to 1 mg weekly -Patient will call [facility] for an appointment -A1c in 3 months -Repeat BMP today  Iron deficiency anemia History of iron deficiency anemia secondary to menorrhagia.  She underwent endometrial ablation in July.  States that her menstrual bleeding is less heavy now.  She still taking iron supplementation.  Patient received iron transfusion in the past ordered by her OB/GYN.  -Recheck CBC and iron study -Continue polysaccharide iron 150 mg every other day  Healthcare maintenance - Patient will reach out to make an appointment for colonoscopy   Patient discussed with Dr. Jimmye Norman

## 2022-02-25 NOTE — Assessment & Plan Note (Signed)
Blood pressure well controlled 114/62.  Her lisinopril-HCTZ was increased to 20-12.5 mg last month.  -Continue current regimen -Recheck BMP

## 2022-02-25 NOTE — Patient Instructions (Addendum)
Nancy Silva,  It was nice seeing you in the clinic today.  Here is a summary what we talked about:  1.  Your blood pressure is doing very well.  Please continue lisinopril-HCTZ once a day.  I will recheck your kidney function today.  2.  I am very happy that A1c came down from 11.2 to 7.4.  Please continue Jardiance 25 mg daily.  I will increase your Ozempic to 1 mg weekly.  Please reach out to [facility] for an appointment.  3.  I will recheck your blood count and iron studies today.  4.  Please schedule your colonoscopy appointment  Please return in 3 months  Take care,  Dr. Alfonse Spruce

## 2022-02-25 NOTE — Assessment & Plan Note (Signed)
She had an significant improvement in her A1c from 11.2 to 7.4.  She is currently taking Jardiance 25 mg and Ozempic 0.5 mg weekly.  She denies any GI side effects from the current dose of Ozempic.  She lost 7 pounds since last visit.  Patient is spending time for exercising and watch her diet.  Her microalbuminuria is elevated at 221.  She is already on an ACE and SGLT2.  -Continue Jardiance 25 mg -Increase Ozempic to 1 mg weekly -Patient will call [facility] for an appointment -A1c in 3 months -Repeat BMP today

## 2022-02-25 NOTE — Assessment & Plan Note (Signed)
-   Patient will reach out to make an appointment for colonoscopy

## 2022-02-25 NOTE — Assessment & Plan Note (Signed)
History of iron deficiency anemia secondary to menorrhagia.  She underwent endometrial ablation in July.  States that her menstrual bleeding is less heavy now.  She still taking iron supplementation.  Patient received iron transfusion in the past ordered by her OB/GYN.  -Recheck CBC and iron study -Continue polysaccharide iron 150 mg every other day

## 2022-02-26 LAB — BMP8+ANION GAP
Anion Gap: 15 mmol/L (ref 10.0–18.0)
BUN/Creatinine Ratio: 15 (ref 9–23)
BUN: 14 mg/dL (ref 6–24)
CO2: 25 mmol/L (ref 20–29)
Calcium: 9.5 mg/dL (ref 8.7–10.2)
Chloride: 98 mmol/L (ref 96–106)
Creatinine, Ser: 0.93 mg/dL (ref 0.57–1.00)
Glucose: 91 mg/dL (ref 70–99)
Potassium: 4 mmol/L (ref 3.5–5.2)
Sodium: 138 mmol/L (ref 134–144)
eGFR: 76 mL/min/{1.73_m2} (ref >59)

## 2022-02-26 LAB — CBC
Hematocrit: 37.8 % (ref 34.0–46.6)
Hemoglobin: 12.2 g/dL (ref 11.1–15.9)
MCH: 26.2 pg — ABNORMAL LOW (ref 26.6–33.0)
MCHC: 32.3 g/dL (ref 31.5–35.7)
MCV: 81 fL (ref 79–97)
Platelets: 426 10*3/uL (ref 150–450)
RBC: 4.66 x10E6/uL (ref 3.77–5.28)
RDW: 13.7 % (ref 11.7–15.4)
WBC: 6.7 10*3/uL (ref 3.4–10.8)

## 2022-02-26 LAB — IRON,TIBC AND FERRITIN PANEL
Ferritin: 68 ng/mL (ref 15–150)
Iron Saturation: 9 % — CL (ref 15–55)
Iron: 27 ug/dL (ref 27–159)
Total Iron Binding Capacity: 292 ug/dL (ref 250–450)
UIBC: 265 ug/dL (ref 131–425)

## 2022-02-26 NOTE — Progress Notes (Signed)
Internal Medicine Clinic Attending  Case discussed with Dr. Nguyen  At the time of the visit.  We reviewed the resident's history and exam and pertinent patient test results.  I agree with the assessment, diagnosis, and plan of care documented in the resident's note. 

## 2022-02-27 ENCOUNTER — Encounter: Payer: Self-pay | Admitting: Student

## 2022-03-02 ENCOUNTER — Other Ambulatory Visit (HOSPITAL_COMMUNITY): Payer: Self-pay

## 2022-03-06 ENCOUNTER — Other Ambulatory Visit (HOSPITAL_COMMUNITY): Payer: Self-pay

## 2022-03-25 ENCOUNTER — Ambulatory Visit (INDEPENDENT_AMBULATORY_CARE_PROVIDER_SITE_OTHER): Payer: 59

## 2022-03-25 ENCOUNTER — Other Ambulatory Visit (HOSPITAL_COMMUNITY)
Admission: RE | Admit: 2022-03-25 | Discharge: 2022-03-25 | Disposition: A | Discharge status: Home / Self Care | Payer: 59 | Source: Ambulatory Visit | Attending: Family Medicine | Admitting: Family Medicine

## 2022-03-25 VITALS — BP 116/75 | HR 89

## 2022-03-25 DIAGNOSIS — N760 Acute vaginitis: Secondary | ICD-10-CM

## 2022-03-25 NOTE — Progress Notes (Signed)
SUBJECTIVE:  46 y.o. female who desires a STI screen. Denies abnormal vaginal discharge, bleeding or significant pelvic pain. Vaginal itching no relief with Diflucan. No UTI symptoms. Denies history of known exposure to STD.  No LMP recorded.  OBJECTIVE:  She appears well.   ASSESSMENT:  STI Screen  Vaginal itching   PLAN:  Pt offered STI blood screening-not indicated GC, chlamydia, and trichomonas probe sent to lab.  Treatment: To be determined once lab results are received.  Pt follow up as needed.

## 2022-03-27 LAB — CERVICOVAGINAL ANCILLARY ONLY
Bacterial Vaginitis (gardnerella): NEGATIVE
Candida Glabrata: NEGATIVE
Candida Vaginitis: POSITIVE — AB
Chlamydia: NEGATIVE
Comment: NEGATIVE
Comment: NEGATIVE
Comment: NEGATIVE
Comment: NEGATIVE
Comment: NEGATIVE
Comment: NORMAL
Neisseria Gonorrhea: NEGATIVE
Trichomonas: NEGATIVE

## 2022-03-29 ENCOUNTER — Other Ambulatory Visit: Payer: Self-pay | Admitting: Student

## 2022-03-29 DIAGNOSIS — I10 Essential (primary) hypertension: Secondary | ICD-10-CM

## 2022-03-29 DIAGNOSIS — E119 Type 2 diabetes mellitus without complications: Secondary | ICD-10-CM

## 2022-03-30 ENCOUNTER — Other Ambulatory Visit (HOSPITAL_COMMUNITY): Payer: Self-pay

## 2022-03-30 MED ORDER — TERCONAZOLE 0.8 % VA CREA
1.0000 | TOPICAL_CREAM | Freq: Every day | VAGINAL | 0 refills | Status: DC
  Filled 2022-03-30: qty 20, 3d supply, fill #0

## 2022-03-30 MED ORDER — FLUCONAZOLE 150 MG PO TABS
150.0000 mg | ORAL_TABLET | Freq: Every day | ORAL | 2 refills | Status: DC
  Filled 2022-03-30: qty 2, 2d supply, fill #0
  Filled 2022-07-05: qty 2, 2d supply, fill #1
  Filled 2022-08-23 – 2022-09-09 (×2): qty 2, 2d supply, fill #2

## 2022-03-30 NOTE — Addendum Note (Signed)
Addended by: Donnamae Jude on: 03/30/2022 08:38 AM   Modules accepted: Orders

## 2022-03-31 ENCOUNTER — Other Ambulatory Visit (HOSPITAL_COMMUNITY): Payer: Self-pay

## 2022-03-31 MED ORDER — LISINOPRIL-HYDROCHLOROTHIAZIDE 20-12.5 MG PO TABS
1.0000 | ORAL_TABLET | Freq: Every day | ORAL | 2 refills | Status: DC
  Filled 2022-03-31: qty 30, 30d supply, fill #0
  Filled 2022-05-01: qty 30, 30d supply, fill #1
  Filled 2022-06-11: qty 30, 30d supply, fill #2

## 2022-03-31 MED ORDER — EMPAGLIFLOZIN 25 MG PO TABS
25.0000 mg | ORAL_TABLET | Freq: Every day | ORAL | 2 refills | Status: DC
  Filled 2022-03-31: qty 30, 30d supply, fill #0
  Filled 2022-05-01: qty 30, 30d supply, fill #1
  Filled 2022-06-11: qty 30, 30d supply, fill #2

## 2022-04-03 ENCOUNTER — Ambulatory Visit (INDEPENDENT_AMBULATORY_CARE_PROVIDER_SITE_OTHER): Payer: 59 | Admitting: Internal Medicine

## 2022-04-03 DIAGNOSIS — U071 COVID-19: Secondary | ICD-10-CM | POA: Diagnosis not present

## 2022-04-03 NOTE — Progress Notes (Signed)
  Nancy Silva Telephone Encounter Continuity Care Appointment  HPI:  This telephone encounter was created for [name] on 04/03/2022 for the following purpose/cc: COVID-19.   Past Medical History:  Past Medical History:  Diagnosis Date   Abnormal uterine bleeding (AUB)    Arthritis    History of seizures as a child    none since age 46 per patient   Hypertension    Iron deficiency anemia    Menorrhagia with irregular cycle 11/06/2016   Pre-diabetes      ROS:  Negative unless otherwise stated.   Assessment / Plan / Recommendations:  COVID-19 Patient reports symptoms of runny nose, cough, sore throat, muscle aches that started yesterday afternoon and she tested positive for COVID-19 with a home test yesterday evening.  She denies fever, chills, diarrhea however she has taken over-the-counter cold and flu medications.  Over-the-counter medication has helped some with her body aches but she has the remainder of the symptoms above.  She has no known sick exposures and thinks she may have gotten it from work.  She did receive the first 3 COVID-19 vaccines but has not had a booster yet this year.  She received her flu shot from work in October.  She is not experiencing shortness of breath and is not having difficulty completing full sentences throughout our conversation. Assessment: Patient with positive home COVID-19 test who does not have major comorbid conditions.  I do not believe that she needs antiviral therapy for COVID-19 at this time.  She will need to quarantine for additional 3 days, with the following 5 days requiring her to be masked when she does leave the home. Plan: Counseling on quarantine provided to patient.  I have encouraged her to continue with over-the-counter cold and flu medications.  I advised that if she develops difficulty with breathing that she report to the emergency department for further evaluation.  Please see A&P under  problem oriented charting for assessment of the patient's acute and chronic medical conditions.  As always, pt is advised that if symptoms worsen or new symptoms arise, they should go to an urgent care facility or to to ER for further evaluation.   Consent and Medical Decision Making:  Patient discussed with Dr. Jimmye Silva This is a telephone encounter between Nancy Silva and Nancy Silva on 04/03/2022 for + home COVID-19 test. The visit was conducted with the patient located at home and Nancy Silva at [facility]. The patient's identity was confirmed using their DOB and current address. The patient has consented to being evaluated through a telephone encounter and understands the associated risks (an examination cannot be done and the patient may need to come in for an appointment) / benefits (allows the patient to remain at home, decreasing exposure to coronavirus). I personally spent 11 minutes on medical discussion.

## 2022-04-03 NOTE — Assessment & Plan Note (Signed)
Patient reports symptoms of runny nose, cough, sore throat, muscle aches that started yesterday afternoon and she tested positive for COVID-19 with a home test yesterday evening.  She denies fever, chills, diarrhea however she has taken over-the-counter cold and flu medications.  Over-the-counter medication has helped some with her body aches but she has the remainder of the symptoms above.  She has no known sick exposures and thinks she may have gotten it from work.  She did receive the first 3 COVID-19 vaccines but has not had a booster yet this year.  She received her flu shot from work in October.  She is not experiencing shortness of breath and is not having difficulty completing full sentences throughout our conversation. Assessment: Patient with positive home COVID-19 test who does not have major comorbid conditions.  I do not believe that she needs antiviral therapy for COVID-19 at this time.  She will need to quarantine for additional 3 days, with the following 5 days requiring her to be masked when she does leave the home. Plan: Counseling on quarantine provided to patient.  I have encouraged her to continue with over-the-counter cold and flu medications.  I advised that if she develops difficulty with breathing that she report to the emergency department for further evaluation.

## 2022-04-07 NOTE — Progress Notes (Signed)
Internal Medicine Clinic Attending ? ?Case discussed with Dr. Dean  At the time of the visit.  We reviewed the resident?s history and exam and pertinent patient test results.  I agree with the assessment, diagnosis, and plan of care documented in the resident?s note.  ?

## 2022-05-01 ENCOUNTER — Other Ambulatory Visit (HOSPITAL_COMMUNITY): Payer: Self-pay

## 2022-05-01 ENCOUNTER — Other Ambulatory Visit: Payer: Self-pay

## 2022-05-29 ENCOUNTER — Other Ambulatory Visit: Payer: Self-pay | Admitting: Student

## 2022-05-29 ENCOUNTER — Other Ambulatory Visit: Payer: Self-pay

## 2022-05-29 ENCOUNTER — Other Ambulatory Visit (HOSPITAL_COMMUNITY): Payer: Self-pay

## 2022-05-29 DIAGNOSIS — D500 Iron deficiency anemia secondary to blood loss (chronic): Secondary | ICD-10-CM

## 2022-05-29 DIAGNOSIS — E119 Type 2 diabetes mellitus without complications: Secondary | ICD-10-CM

## 2022-05-29 MED ORDER — OZEMPIC (1 MG/DOSE) 4 MG/3ML ~~LOC~~ SOPN
1.0000 mg | PEN_INJECTOR | SUBCUTANEOUS | 2 refills | Status: DC
  Filled 2022-05-29 – 2022-06-11 (×2): qty 3, 28d supply, fill #0
  Filled 2022-07-05: qty 3, 28d supply, fill #1
  Filled 2022-08-23 – 2022-09-09 (×2): qty 3, 28d supply, fill #2

## 2022-05-29 MED ORDER — POLYSACCHARIDE IRON COMPLEX 150 MG PO CAPS
150.0000 mg | ORAL_CAPSULE | ORAL | 2 refills | Status: DC
  Filled 2022-05-29 – 2022-06-11 (×2): qty 15, 30d supply, fill #0
  Filled 2022-07-05: qty 15, 30d supply, fill #1
  Filled 2022-08-23 – 2022-09-09 (×2): qty 15, 30d supply, fill #2

## 2022-06-03 ENCOUNTER — Ambulatory Visit (INDEPENDENT_AMBULATORY_CARE_PROVIDER_SITE_OTHER): Payer: 59 | Admitting: Internal Medicine

## 2022-06-03 ENCOUNTER — Other Ambulatory Visit (HOSPITAL_COMMUNITY): Payer: Self-pay

## 2022-06-03 ENCOUNTER — Encounter: Payer: Self-pay | Admitting: Internal Medicine

## 2022-06-03 VITALS — BP 108/70 | HR 81 | Temp 98.2°F | Wt 199.6 lb

## 2022-06-03 DIAGNOSIS — R5383 Other fatigue: Secondary | ICD-10-CM

## 2022-06-03 DIAGNOSIS — E119 Type 2 diabetes mellitus without complications: Secondary | ICD-10-CM | POA: Diagnosis not present

## 2022-06-03 DIAGNOSIS — R519 Headache, unspecified: Secondary | ICD-10-CM

## 2022-06-03 LAB — POCT GLYCOSYLATED HEMOGLOBIN (HGB A1C): Hemoglobin A1C: 5.9 % — AB (ref 4.0–5.6)

## 2022-06-03 LAB — GLUCOSE, CAPILLARY: Glucose-Capillary: 90 mg/dL (ref 70–99)

## 2022-06-03 MED ORDER — BLOOD GLUCOSE MONITOR KIT
PACK | Freq: Four times a day (QID) | 0 refills | Status: AC
  Filled 2022-06-03: qty 1, 30d supply, fill #0

## 2022-06-03 NOTE — Progress Notes (Signed)
   CC: DM, fatigue, HA  HPI:[name] is a 47 y.o. female who presents for evaluation of DM, fatigue, HA. Please see individual problem based A/P for details.  72 yof with hx htn, dm2 anemia, obesity   Depression, PHQ-9: Based on the patients  [place] Visit from 06/03/2022 in [place]  PHQ-9 Total Score 1      score we have .  Past Medical History:  Diagnosis Date   Abnormal uterine bleeding (AUB)    Arthritis    History of seizures as a child    none since age 47 per patient   Hypertension    Iron deficiency anemia    Menorrhagia with irregular cycle 11/06/2016   Pre-diabetes    Review of Systems:   See hpi  Physical Exam: Vitals:   06/03/22 1407  BP: 108/70  Pulse: 81  Temp: 98.2 F (36.8 C)  TempSrc: Oral  SpO2: 99%  Weight: 199 lb 9.6 oz (90.5 kg)   General: nad HEENT: Conjunctiva nl , antiicteric sclerae, moist mucous membranes, no exudate or erythema Cardiovascular: Normal rate, regular rhythm.  No murmurs, rubs, or gallops Pulmonary : Equal breath sounds, No wheezes, rales, or rhonchi Abdominal: soft, nontender,  bowel sounds present Ext: No edema in lower extremities, no tenderness to palpation of lower extremities.  Neuro: no focal abnormalities  Assessment & Plan:   See Encounters Tab for problem based charting.  Patient discussed with Dr.  Cain Silva

## 2022-06-03 NOTE — Patient Instructions (Signed)
Dear Nancy Silva,  Thank you for trusting [place] with your care today.   We discussed your diabetes, fatigue, and headache.  For your diabetes, we will not make any changes to the medications today. We will refer your to [facility] for diabetic services and order you a glucometer.   For your fatigue, we would like for you to get a sleep study done.   For the headaches, this may be related to dehydration. Please ensure that you are staying hydrated. Please call us if anything changes.   Please follow up in 3 months.

## 2022-06-04 ENCOUNTER — Other Ambulatory Visit (HOSPITAL_COMMUNITY): Payer: Self-pay

## 2022-06-04 LAB — CBC WITH DIFFERENTIAL/PLATELET
Basophils Absolute: 0.1 10*3/uL (ref 0.0–0.2)
Basos: 1 %
EOS (ABSOLUTE): 0.2 10*3/uL (ref 0.0–0.4)
Eos: 3 %
Hematocrit: 37 % (ref 34.0–46.6)
Hemoglobin: 11.8 g/dL (ref 11.1–15.9)
Immature Grans (Abs): 0 10*3/uL (ref 0.0–0.1)
Immature Granulocytes: 0 %
Lymphocytes Absolute: 2.7 10*3/uL (ref 0.7–3.1)
Lymphs: 31 %
MCH: 25.7 pg — ABNORMAL LOW (ref 26.6–33.0)
MCHC: 31.9 g/dL (ref 31.5–35.7)
MCV: 80 fL (ref 79–97)
Monocytes Absolute: 0.6 10*3/uL (ref 0.1–0.9)
Monocytes: 7 %
Neutrophils Absolute: 5 10*3/uL (ref 1.4–7.0)
Neutrophils: 58 %
Platelets: 436 10*3/uL (ref 150–450)
RBC: 4.6 x10E6/uL (ref 3.77–5.28)
RDW: 15.4 % (ref 11.7–15.4)
WBC: 8.6 10*3/uL (ref 3.4–10.8)

## 2022-06-04 MED ORDER — GLUCOSE BLOOD VI STRP
ORAL_STRIP | 0 refills | Status: AC
  Filled 2022-06-04: qty 100, 25d supply, fill #0

## 2022-06-04 MED ORDER — MICROLET LANCETS MISC
0 refills | Status: AC
  Filled 2022-06-04: qty 100, 30d supply, fill #0

## 2022-06-07 ENCOUNTER — Encounter: Payer: Self-pay | Admitting: Internal Medicine

## 2022-06-07 DIAGNOSIS — R400 Somnolence: Secondary | ICD-10-CM | POA: Insufficient documentation

## 2022-06-07 DIAGNOSIS — R5383 Other fatigue: Secondary | ICD-10-CM | POA: Insufficient documentation

## 2022-06-07 NOTE — Assessment & Plan Note (Signed)
Patient reports headaches for past 2 weeks. Ongoing roughly same duration as her fatigue. It involves whole, constant, every day. Has been taking tylenol extra strength but not every day. Has had some associated nausea, but no vomiting. No photophobia, tearing or rhinnorhea. She thinks she may be dehydrated as well.  HA not consistent with migraine or cluster. Most consistent with tension type, but may be related to untreated OSA which we are investigating. Encourage adequate hydration and OTC tylenol as needed.

## 2022-06-07 NOTE — Assessment & Plan Note (Signed)
Requesting home glucose monitoring glucometer and regerral to nutrition for counseling. Not noticed any symptoms  Has been making lifestyle changes. She is not interested in increasing ozempic.   A1c 5.9 well controlled. Will not make any changes to medication regimen. Will refer to diabetic edu and glucmeter.

## 2022-06-07 NOTE — Assessment & Plan Note (Signed)
Patient reports for past 1-2 weeks she has been feeling more fatigued than typical. Normally able to work and do house chores without issue, now struggles to complete chores at end of day. She has been taking iron supplements. Also reports that she snores but has never had sleep study done.   Will check CBC to evaluate for IDA. Patient intermediate risk for OSA so we will refer for sleep study as well.

## 2022-06-08 NOTE — Progress Notes (Signed)
Internal Medicine Clinic Attending ° °Case discussed with Dr. Gawaluck  At the time of the visit.  We reviewed the resident’s history and exam and pertinent patient test results.  I agree with the assessment, diagnosis, and plan of care documented in the resident’s note.  °

## 2022-06-09 ENCOUNTER — Other Ambulatory Visit (HOSPITAL_COMMUNITY): Payer: Self-pay

## 2022-06-10 ENCOUNTER — Encounter: Payer: 59 | Admitting: Student

## 2022-06-12 ENCOUNTER — Other Ambulatory Visit (HOSPITAL_COMMUNITY): Payer: Self-pay

## 2022-07-05 ENCOUNTER — Other Ambulatory Visit: Payer: Self-pay | Admitting: Student

## 2022-07-05 DIAGNOSIS — E119 Type 2 diabetes mellitus without complications: Secondary | ICD-10-CM

## 2022-07-05 DIAGNOSIS — I10 Essential (primary) hypertension: Secondary | ICD-10-CM

## 2022-07-07 ENCOUNTER — Other Ambulatory Visit (HOSPITAL_COMMUNITY): Payer: Self-pay

## 2022-07-07 MED ORDER — EMPAGLIFLOZIN 25 MG PO TABS
25.0000 mg | ORAL_TABLET | Freq: Every day | ORAL | 5 refills | Status: DC
  Filled 2022-07-07: qty 30, 30d supply, fill #0
  Filled 2022-08-23: qty 30, 30d supply, fill #1

## 2022-07-07 MED ORDER — LISINOPRIL-HYDROCHLOROTHIAZIDE 20-12.5 MG PO TABS
1.0000 | ORAL_TABLET | Freq: Every day | ORAL | 5 refills | Status: AC
  Filled 2022-07-07: qty 30, 30d supply, fill #0
  Filled 2022-08-23 – 2022-09-09 (×2): qty 30, 30d supply, fill #1
  Filled 2022-10-05: qty 30, 30d supply, fill #2
  Filled 2022-11-12 – 2022-12-10 (×2): qty 30, 30d supply, fill #3
  Filled 2023-01-08: qty 30, 30d supply, fill #4
  Filled 2023-02-08 – 2023-05-14 (×2): qty 30, 30d supply, fill #5

## 2022-07-07 NOTE — Telephone Encounter (Signed)
Next appt scheduled 5/8 with PCP.

## 2022-07-10 ENCOUNTER — Other Ambulatory Visit (HOSPITAL_COMMUNITY): Payer: Self-pay

## 2022-07-10 ENCOUNTER — Other Ambulatory Visit: Payer: Self-pay

## 2022-07-31 ENCOUNTER — Telehealth: Payer: 59 | Admitting: Nurse Practitioner

## 2022-07-31 DIAGNOSIS — R112 Nausea with vomiting, unspecified: Secondary | ICD-10-CM

## 2022-07-31 NOTE — Progress Notes (Signed)
Because you have been vomiting for over 48 hours, I feel your condition warrants further evaluation and I recommend that you be seen in a face to face visit.   NOTE: There will be NO CHARGE for this eVisit   If you are having a true medical emergency please call 911.      For an urgent face to face visit, [facility] has eight urgent care centers for your convenience:   NEW!! [facility] at [place] Get Driving Directions [phone] [address], [address] [place], [address]    [facility] at [place] Get Driving Directions [phone] [address] [place], [place] [address]   [facility] ([place]) Get Driving Directions [phone] [address] [place], [place] [address]  [facility] ([place] Get Driving Directions [phone] [address] [place],  [place]  [address]  [facility] ([place] at [place] Get Driving Directions  [phone] [address] ,  [place] [address]   [facility] Get Driving Directions [phone] [address], [address] [place], [place] [address]   [facility] [place] Get Driving Directions  [phone] [address] [place], [place] [address]   [facility] [place] Get Driving Directions [phone] [address] , [place] [address]  Your MyChart E-visit questionnaire answers were reviewed by a board certified advanced clinical practitioner to complete your personal care plan based on your specific symptoms.  Thank you for using e-Visits. 

## 2022-08-24 ENCOUNTER — Other Ambulatory Visit: Payer: Self-pay

## 2022-09-02 ENCOUNTER — Other Ambulatory Visit (HOSPITAL_COMMUNITY): Payer: Self-pay

## 2022-09-02 ENCOUNTER — Encounter: Payer: Self-pay | Admitting: Student

## 2022-09-02 ENCOUNTER — Ambulatory Visit (INDEPENDENT_AMBULATORY_CARE_PROVIDER_SITE_OTHER): Payer: 59 | Admitting: Student

## 2022-09-02 VITALS — BP 135/86 | HR 94 | Temp 98.3°F | Wt 205.6 lb

## 2022-09-02 DIAGNOSIS — R400 Somnolence: Secondary | ICD-10-CM | POA: Diagnosis not present

## 2022-09-02 DIAGNOSIS — Z1211 Encounter for screening for malignant neoplasm of colon: Secondary | ICD-10-CM

## 2022-09-02 DIAGNOSIS — Z0000 Encounter for general adult medical examination without abnormal findings: Secondary | ICD-10-CM

## 2022-09-02 DIAGNOSIS — Z7984 Long term (current) use of oral hypoglycemic drugs: Secondary | ICD-10-CM | POA: Diagnosis not present

## 2022-09-02 DIAGNOSIS — E119 Type 2 diabetes mellitus without complications: Secondary | ICD-10-CM

## 2022-09-02 DIAGNOSIS — Z7985 Long-term (current) use of injectable non-insulin antidiabetic drugs: Secondary | ICD-10-CM

## 2022-09-02 DIAGNOSIS — I10 Essential (primary) hypertension: Secondary | ICD-10-CM

## 2022-09-02 LAB — POCT GLYCOSYLATED HEMOGLOBIN (HGB A1C): Hemoglobin A1C: 5.5 % (ref 4.0–5.6)

## 2022-09-02 LAB — GLUCOSE, CAPILLARY: Glucose-Capillary: 121 mg/dL — ABNORMAL HIGH (ref 70–99)

## 2022-09-02 MED ORDER — EMPAGLIFLOZIN 10 MG PO TABS
10.0000 mg | ORAL_TABLET | Freq: Every day | ORAL | 3 refills | Status: DC
Start: 2022-09-02 — End: 2023-03-01
  Filled 2022-09-02: qty 30, 30d supply, fill #0
  Filled 2022-10-05: qty 30, 30d supply, fill #1
  Filled 2022-11-12 – 2022-12-10 (×2): qty 30, 30d supply, fill #2
  Filled 2023-01-08: qty 30, 30d supply, fill #3

## 2022-09-02 NOTE — Assessment & Plan Note (Addendum)
A1c is 5.5.  She is taking Jardiance 25 mg and Ozempic 1 mg weekly.  She is happy with the current dose of Ozempic which helped her lost about 30 pounds since last year.  She tolerates this dose of Ozempic well but does not want to increase the dose.  She reports more frequent yeast infection after starting Jardiance.  She has 1 yeast infection every other month which is bothersome for her.  Patient however is not comfortable of stopping Jardiance since her diabetes is so well-controlled.  We reached an agreement to lower the dose of Jardiance to hopefully reduce the frequency of her yeast infection.  I also advised patient to increase water intake since Jardiance and HCTZ can cause of urinary frequency.  Patient is up-to-date on ophthalmology follow-up and foot exam  -Continue Ozempic 1 mg weekly -Reduce Jardiance to 10 mg daily

## 2022-09-02 NOTE — Assessment & Plan Note (Signed)
-  Fit test for colon cancer screening

## 2022-09-02 NOTE — Assessment & Plan Note (Signed)
Patient reports symptoms of snoring and daytime somnolence.  Her STOP-BANG score is 3 which put her at intermediate risk for sleep apnea.  She is also being treated for hypertension with medication.  -Will obtain a sleep study to rule out sleep apnea.  Her sister also has sleep apnea and using a CPAP machine so she is comfortable with using a CPAP if she has to.

## 2022-09-02 NOTE — Progress Notes (Signed)
CC: Follow-up on A1c  HPI:  Nancy Silva is a 47 y.o. living with hypertension, type 2 diabetes, iron deficiency anemia, who presents to the clinic for blood pressure and A1c recheck.  She is in the process of switching her PCP to [facility].  Please see problem based charting for detail  Past Medical History:  Diagnosis Date   Abnormal uterine bleeding (AUB)    Arthritis    History of seizures as a child    none since age 67 per patient   Hypertension    Iron deficiency anemia    Menorrhagia with irregular cycle 11/06/2016   Pre-diabetes    Review of Systems:  per HPI  Physical Exam:  Vitals:   09/02/22 0851  BP: 135/86  Pulse: 94  Temp: 98.3 F (36.8 C)  TempSrc: Oral  SpO2: 99%  Weight: 205 lb 9.6 oz (93.3 kg)   Physical Exam Eyes:     General:        Right eye: No discharge.        Left eye: No discharge.     Conjunctiva/sclera: Conjunctivae normal.  Cardiovascular:     Rate and Rhythm: Normal rate and regular rhythm.  Pulmonary:     Effort: Pulmonary effort is normal. No respiratory distress.     Breath sounds: Normal breath sounds. No wheezing.  Musculoskeletal:     Cervical back: Normal range of motion.     Right lower leg: No edema.     Left lower leg: No edema.  Skin:    General: Skin is warm.  Neurological:     Mental Status: She is alert. Mental status is at baseline.  Psychiatric:        Mood and Affect: Mood normal.      Assessment & Plan:   See Encounters Tab for problem based charting.  Essential hypertension Blood pressure is reasonably controlled this morning 135/86.  She reports adherence to lisinopril-HCTZ but did not take her medications this morning.  With new data from the Sprint trial, tighter blood pressure control with systolic of 120 may have a better outcome for younger patients.  Since she is establishing with a different PCP next month, I would not adjust any medication today.  Advised patient to  take her medication prior to visit and discuss this topic with her new PCP.  Her kidney function checked a few months ago was normal, will not repeat  Type 2 diabetes mellitus ([facility]) A1c is 5.5.  She is taking Jardiance 25 mg and Ozempic 1 mg weekly.  She is happy with the current dose of Ozempic which helped her lost about 30 pounds since last year.  She tolerates this dose of Ozempic well but does not want to increase the dose.  She reports more frequent yeast infection after starting Jardiance.  She has 1 yeast infection every other month which is bothersome for her.  Patient however is not comfortable of stopping Jardiance since her diabetes is so well-controlled.  We reached an agreement to lower the dose of Jardiance to hopefully reduce the frequency of her yeast infection.  I also advised patient to increase water intake since Jardiance and HCTZ can cause of urinary frequency.  Patient is up-to-date on ophthalmology follow-up and foot exam  -Continue Ozempic 1 mg weekly -Reduce Jardiance to 10 mg daily  Daytime somnolence Patient reports symptoms of snoring and daytime somnolence.  Her STOP-BANG score is 3 which put her at intermediate risk for sleep  apnea.  She is also being treated for hypertension with medication.  -Will obtain a sleep study to rule out sleep apnea.  Her sister also has sleep apnea and using a CPAP machine so she is comfortable with using a CPAP if she has to.  Health care maintenance -Fit test for colon cancer screening   Patient discussed with Dr. Heide Spark

## 2022-09-02 NOTE — Assessment & Plan Note (Addendum)
Blood pressure is reasonably controlled this morning 135/86.  She reports adherence to lisinopril-HCTZ but did not take her medications this morning.  With data from the Sprint trial, tighter blood pressure control with systolic of 120 may have a better outcome for younger patients.  Since she is establishing with a different PCP next month, I would not adjust any medication today.  Advised patient to take her medication prior to visit and discuss this topic with her new PCP.  Her kidney function checked a few months ago was normal, will not repeat

## 2022-09-02 NOTE — Progress Notes (Signed)
Internal Medicine Clinic Attending  Case discussed with Dr. Nguyen  At the time of the visit.  We reviewed the resident's history and exam and pertinent patient test results.  I agree with the assessment, diagnosis, and plan of care documented in the resident's note. 

## 2022-09-02 NOTE — Patient Instructions (Signed)
Nancy Silva,  It was nice seeing you in the clinic today.  I am glad you are doing well.  1.  Blood pressure is well-controlled but new studies have shown that people can have better cardiovascular outcome with tighter blood pressure control < 120/70.  You can discuss this with your new PCP about adjusting your medication.  2.  Your A1c is well-controlled at 5.5.  Please continue Ozempic 1 mg weekly.  We will lower your Jardiance dose to 10 mg to hopefully reduce the frequency of yeast infection.  3.  We will reorder the sleep study for you  4.  Please obtain stool kit for colon cancer screening  Take care  Dr. Cyndie Chime

## 2022-09-04 ENCOUNTER — Other Ambulatory Visit (HOSPITAL_COMMUNITY): Payer: Self-pay

## 2022-09-07 ENCOUNTER — Encounter: Payer: 59 | Admitting: Dietician

## 2022-09-09 ENCOUNTER — Other Ambulatory Visit (HOSPITAL_COMMUNITY): Payer: Self-pay

## 2022-09-10 ENCOUNTER — Other Ambulatory Visit (HOSPITAL_COMMUNITY): Payer: Self-pay

## 2022-09-10 ENCOUNTER — Other Ambulatory Visit: Payer: Self-pay

## 2022-09-14 ENCOUNTER — Encounter: Payer: 59 | Admitting: Dietician

## 2022-09-21 ENCOUNTER — Encounter: Payer: Self-pay | Admitting: *Deleted

## 2022-10-05 ENCOUNTER — Other Ambulatory Visit: Payer: Self-pay | Admitting: Student

## 2022-10-05 DIAGNOSIS — D500 Iron deficiency anemia secondary to blood loss (chronic): Secondary | ICD-10-CM

## 2022-10-05 DIAGNOSIS — E119 Type 2 diabetes mellitus without complications: Secondary | ICD-10-CM

## 2022-10-06 ENCOUNTER — Other Ambulatory Visit: Payer: Self-pay

## 2022-10-08 ENCOUNTER — Other Ambulatory Visit (HOSPITAL_COMMUNITY): Payer: Self-pay

## 2022-10-08 MED ORDER — POLYSACCHARIDE IRON COMPLEX 150 MG PO CAPS
150.0000 mg | ORAL_CAPSULE | ORAL | 2 refills | Status: DC
  Filled 2022-10-08: qty 15, 30d supply, fill #0
  Filled 2022-11-12 – 2022-12-10 (×2): qty 15, 30d supply, fill #1

## 2022-10-08 MED ORDER — OZEMPIC (1 MG/DOSE) 4 MG/3ML ~~LOC~~ SOPN
1.0000 mg | PEN_INJECTOR | SUBCUTANEOUS | 2 refills | Status: DC
Start: 2022-10-08 — End: 2023-03-01
  Filled 2022-10-08: qty 3, 28d supply, fill #0
  Filled 2022-11-12 – 2022-12-10 (×2): qty 3, 28d supply, fill #1
  Filled 2023-01-08: qty 3, 28d supply, fill #2

## 2022-10-15 NOTE — Telephone Encounter (Signed)
Patient has transferred care to [facility].  She a recent office visit with the office on 09/03/2022 with Lauretta Chester, FNP.  Forwarding back to Dr. Daiva Eves and triage nurse, along with removing [place] as primary care.

## 2022-11-12 ENCOUNTER — Other Ambulatory Visit: Payer: Self-pay

## 2022-11-13 ENCOUNTER — Other Ambulatory Visit: Payer: Self-pay

## 2022-11-13 ENCOUNTER — Other Ambulatory Visit (HOSPITAL_COMMUNITY): Payer: Self-pay

## 2022-11-19 ENCOUNTER — Other Ambulatory Visit (HOSPITAL_COMMUNITY): Payer: Self-pay

## 2022-11-23 ENCOUNTER — Telehealth: Payer: Self-pay

## 2022-11-23 NOTE — Telephone Encounter (Signed)
Pharmacy Patient Advocate Encounter   Received notification from CoverMyMeds that prior authorization for [facility] is required/requested.   PA required; PA submitted to [facility] via CoverMyMeds Key/confirmation #/EOC [id number] Status is pending

## 2022-11-23 NOTE — Telephone Encounter (Signed)
Pharmacy Patient Advocate Encounter  Received notification from [facility] that Prior Authorization for [facility] has been  APPROVED 11/23/22 - 11/23/23

## 2022-11-24 ENCOUNTER — Other Ambulatory Visit (HOSPITAL_COMMUNITY): Payer: Self-pay

## 2022-12-10 ENCOUNTER — Other Ambulatory Visit (HOSPITAL_COMMUNITY): Payer: Self-pay

## 2022-12-11 ENCOUNTER — Other Ambulatory Visit (HOSPITAL_COMMUNITY): Payer: Self-pay

## 2023-02-09 ENCOUNTER — Encounter: Payer: Self-pay | Admitting: Pharmacist

## 2023-02-09 ENCOUNTER — Other Ambulatory Visit: Payer: Self-pay

## 2023-02-10 ENCOUNTER — Other Ambulatory Visit (HOSPITAL_COMMUNITY): Payer: Self-pay

## 2023-02-12 ENCOUNTER — Other Ambulatory Visit: Payer: Self-pay

## 2023-03-01 ENCOUNTER — Other Ambulatory Visit (HOSPITAL_COMMUNITY): Payer: Self-pay

## 2023-03-01 MED ORDER — OZEMPIC (1 MG/DOSE) 4 MG/3ML ~~LOC~~ SOPN
1.0000 mg | PEN_INJECTOR | SUBCUTANEOUS | 3 refills | Status: DC
  Filled 2023-03-01: qty 3, 28d supply, fill #0
  Filled 2023-05-14: qty 3, 28d supply, fill #1
  Filled 2023-06-06: qty 3, 28d supply, fill #2
  Filled 2023-07-15: qty 3, 28d supply, fill #3

## 2023-03-01 MED ORDER — LISINOPRIL-HYDROCHLOROTHIAZIDE 20-12.5 MG PO TABS
1.0000 | ORAL_TABLET | Freq: Every day | ORAL | 3 refills | Status: AC
  Filled 2023-03-01: qty 30, 30d supply, fill #0
  Filled 2023-06-10: qty 30, 30d supply, fill #1
  Filled 2023-07-15: qty 30, 30d supply, fill #2
  Filled 2023-08-19: qty 30, 30d supply, fill #3
  Filled 2023-09-22: qty 30, 30d supply, fill #4
  Filled 2024-01-07: qty 30, 30d supply, fill #5
  Filled 2024-02-03: qty 30, 30d supply, fill #6

## 2023-03-01 MED ORDER — JARDIANCE 10 MG PO TABS
10.0000 mg | ORAL_TABLET | Freq: Every day | ORAL | 1 refills | Status: AC
  Filled 2023-03-01: qty 30, 30d supply, fill #0
  Filled 2023-05-14: qty 30, 30d supply, fill #1
  Filled 2023-06-10: qty 30, 30d supply, fill #2
  Filled 2023-07-15: qty 30, 30d supply, fill #3
  Filled 2023-08-19: qty 30, 30d supply, fill #4
  Filled 2023-09-22: qty 30, 30d supply, fill #5

## 2023-03-01 MED ORDER — FLUCONAZOLE 150 MG PO TABS
150.0000 mg | ORAL_TABLET | Freq: Once | ORAL | 0 refills | Status: DC | PRN
  Filled 2023-03-01: qty 1, 1d supply, fill #0

## 2023-03-01 MED ORDER — POLYSACCHARIDE IRON COMPLEX 150 MG PO CAPS
150.0000 mg | ORAL_CAPSULE | ORAL | 3 refills | Status: DC
  Filled 2023-03-01: qty 15, 30d supply, fill #0
  Filled 2023-10-15: qty 15, 30d supply, fill #1

## 2023-03-04 ENCOUNTER — Other Ambulatory Visit (HOSPITAL_COMMUNITY): Payer: Self-pay

## 2023-03-05 ENCOUNTER — Ambulatory Visit
Admission: RE | Admit: 2023-03-05 | Discharge: 2023-03-05 | Disposition: A | Discharge status: Home / Self Care | Payer: 59 | Source: Ambulatory Visit | Attending: Internal Medicine | Admitting: Internal Medicine

## 2023-03-05 ENCOUNTER — Other Ambulatory Visit: Payer: Self-pay

## 2023-03-05 ENCOUNTER — Ambulatory Visit: Payer: 59

## 2023-03-05 VITALS — BP 121/84 | HR 86 | Temp 98.0°F | Resp 16

## 2023-03-05 DIAGNOSIS — M25572 Pain in left ankle and joints of left foot: Secondary | ICD-10-CM

## 2023-03-05 DIAGNOSIS — M79671 Pain in right foot: Secondary | ICD-10-CM | POA: Insufficient documentation

## 2023-03-05 DIAGNOSIS — M79672 Pain in left foot: Secondary | ICD-10-CM | POA: Insufficient documentation

## 2023-03-05 DIAGNOSIS — Z20822 Contact with and (suspected) exposure to covid-19: Secondary | ICD-10-CM | POA: Insufficient documentation

## 2023-03-05 HISTORY — DX: Type 2 diabetes mellitus without complications: E11.9

## 2023-03-05 MED ORDER — PREDNISONE 20 MG PO TABS: 40.0000 mg | ORAL_TABLET | Freq: Every day | ORAL | 0 refills | Status: AC

## 2023-03-05 NOTE — ED Provider Notes (Incomplete)
[facility]    CSN: [id number] Arrival date & time: 03/05/23  1819      History   Chief Complaint Chief Complaint  Patient presents with   Foot Pain    Constant pain in foot, painful to put pressure on it - Entered by patient    HPI Nancy Silva is a 47 y.o. female.   Patient presents with left foot pain that started about 2 days ago.  She denies any injury or history of chronic foot pain.  Reports that the pain is mainly present in the left great toe that extends to the ball the foot.  She is also having some ankle swelling. Has been taking Tylenol with minimal improvement.  Denies numbness or tingling.  Patient is able to bear weight.  Denies any recent long distance travel in a car or plane.  Denies recently eating any high purine foods. Denies fever.   Patient also reporting approximately 5 to 6-day history of nasal congestion and coughing.  Patient is requesting COVID test.  She is taking Coricidin and Benadryl over-the-counter for symptoms.  Patient is not reporting any chest pain or shortness of breath.  Denies fever no sick contacts.   Foot Pain    Past Medical History:  Diagnosis Date   Abnormal uterine bleeding (AUB)    Arthritis    Diabetes mellitus without complication (HCC)    History of seizures as a child    none since age 61 per patient   Hypertension    Iron deficiency anemia    Menorrhagia with irregular cycle 11/06/2016   Pre-diabetes     Patient Active Problem List   Diagnosis Date Noted   Daytime somnolence 06/07/2022   COVID-19 04/03/2022   Headache 01/05/2022   Type 2 diabetes mellitus (HCC) 12/12/2021   Arthritis of left acromioclavicular joint 12/05/2020   Multiple food allergies 11/28/2012   Iron deficiency anemia 11/14/2012   Obesity, unspecified 11/14/2012   Seasonal allergies 12/15/2010   Health care maintenance 11/10/2010   Essential hypertension 03/01/2006    Past Surgical History:  Procedure Laterality Date    CESAREAN SECTION WITH BILATERAL TUBAL LIGATION  02/21/2010   @[facility]    DILITATION & CURRETTAGE/HYSTROSCOPY WITH HYDROTHERMAL ABLATION N/A 11/12/2021   Procedure: DILATATION & /HYSTEROSCOPY WITH HYDROTHERMAL ABLATION;  Surgeon: Tereso Newcomer, MD;  Location: [facility];  Service: Gynecology;  Laterality: N/A;    OB History     Gravida  3   Para  2   Term  2   Preterm  0   AB  1   Living  2      SAB  1   IAB  0   Ectopic  0   Multiple  0   Live Births               Home Medications    Prior to Admission medications   Medication Sig Start Date End Date Taking? Authorizing Provider  empagliflozin (JARDIANCE) 10 MG TABS tablet Take 1 tablet (10 mg total) by mouth daily. 03/01/23  Yes   fluconazole (DIFLUCAN) 150 MG tablet Take 1 tablet (150 mg total) by mouth once as needed for up to 1 dose. 03/01/23  Yes   iron polysaccharides (FERREX 150) 150 MG capsule Take 1 capsule (150 mg total) by mouth every other day. 03/01/23  Yes   lisinopril-hydrochlorothiazide (ZESTORETIC) 20-12.5 MG tablet Take 1 tablet by mouth daily. 03/01/23  Yes   naproxen (NAPROSYN) 500 MG tablet  Take 1 tablet (500 mg total) by mouth 2 (two) times daily with a meal as needed for pain or during heavy menstrual bleeding. 01/21/22  Yes Anyanwu, Jethro Bastos, MD  predniSONE (DELTASONE) 20 MG tablet Take 2 tablets (40 mg total) by mouth daily for 5 days. 03/05/23 03/10/23 Yes Mahamadou Weltz E, FNP  Semaglutide, 1 MG/DOSE, (OZEMPIC, 1 MG/DOSE,) 4 MG/3ML SOPN Inject 1 mg into the skin once a week. 03/01/23  Yes   blood glucose meter kit and supplies KIT Use up to 4 (four) times daily as directed. 06/03/22   Adron Bene, MD  EPINEPHrine (EPI-PEN) 0.3 mg/0.3 mL SOAJ injection Inject 0.3 mLs (0.3 mg total) into the muscle once. Patient not taking: Reported on 09/09/2021 06/19/13   Ky Barban, MD  fluconazole (DIFLUCAN) 150 MG tablet Take 1 tablet (150 mg total) by mouth daily. Repeat in 24 hours if  needed 03/30/22   Reva Bores, MD  glucose blood test strip use up to 4 times daily as directed. 06/03/22   Adron Bene, MD  iron polysaccharides (FERREX 150) 150 MG capsule Take 1 capsule (150 mg total) by mouth every other day. 10/08/22   Morene Crocker, MD  Microlet Lancets MISC use up to 4 times daily as directed 06/03/22   Adron Bene, MD  lisinopril-hydrochlorothiazide (ZESTORETIC) 20-12.5 MG tablet Take 1 tablet by mouth daily. 07/07/22   Doran Stabler, DO  Multiple Vitamin (MULTIVITAMIN WITH MINERALS) TABS tablet Take 1 tablet by mouth daily.    [provider]    Family History Family History  Problem Relation Age of Onset   Ovarian cancer Mother        Mother died in her 28s, shorlty the pt was born    Breast cancer Cousin        diagnosed at age 49    Social History Social History   Tobacco Use   Smoking status: Former    Current packs/day: 0.00    Types: Cigarettes    Quit date: 11/10/2007    Years since quitting: 15.3    Passive exposure: Past   Smokeless tobacco: Never  Vaping Use   Vaping status: Never Used  Substance Use Topics   Alcohol use: No    Alcohol/week: 0.0 standard drinks of alcohol   Drug use: No     Allergies   Peanuts [nuts] and Shellfish allergy   Review of Systems Review of Systems Per HPI  Physical Exam Triage Vital Signs ED Triage Vitals  Encounter Vitals Group     BP 03/05/23 1826 121/84     Systolic BP Percentile --      Diastolic BP Percentile --      Pulse Rate 03/05/23 1826 86     Resp 03/05/23 1826 16     Temp 03/05/23 1826 98 F (36.7 C)     Temp Source 03/05/23 1826 Oral     SpO2 03/05/23 1826 98 %     Weight --      Height --      Head Circumference --      Peak Flow --      Pain Score 03/05/23 1840 6     Pain Loc --      Pain Education --      Exclude from Growth Chart --    No data found.  Updated Vital Signs BP 121/84 (BP Location: Left Arm)   Pulse 86   Temp 98 F (36.7 C)  (Oral)   Resp  16   LMP 03/02/2023   SpO2 98%   Visual Acuity Right Eye Distance:   Left Eye Distance:   Bilateral Distance:    Right Eye Near:   Left Eye Near:    Bilateral Near:     Physical Exam Constitutional:      General: She is not in acute distress.    Appearance: Normal appearance. She is not toxic-appearing or diaphoretic.  HENT:     Head: Normocephalic and atraumatic.     Right Ear: Tympanic membrane and ear canal normal.     Left Ear: Tympanic membrane and ear canal normal.     Nose: Congestion present.     Mouth/Throat:     Mouth: Mucous membranes are moist.     Pharynx: No posterior oropharyngeal erythema.  Eyes:     Extraocular Movements: Extraocular movements intact.     Conjunctiva/sclera: Conjunctivae normal.     Pupils: Pupils are equal, round, and reactive to light.  Cardiovascular:     Rate and Rhythm: Normal rate and regular rhythm.     Pulses: Normal pulses.     Heart sounds: Normal heart sounds.  Pulmonary:     Effort: Pulmonary effort is normal. No respiratory distress.     Breath sounds: Normal breath sounds. No stridor. No wheezing, rhonchi or rales.  Abdominal:     General: Abdomen is flat. Bowel sounds are normal.     Palpations: Abdomen is soft.  Musculoskeletal:        General: Normal range of motion.     Cervical back: Normal range of motion.  Feet:     Comments: Mild tenderness to palpation with no discoloration or swelling present to the MTP joint of the left foot that extends slightly into the toe.  Patient can wiggle toes.  No other tenderness to foot.  Mild swelling with mild tenderness to palpation to lateral malleolus of left ankle.  No discoloration.  Patient can bear weight.  Capillary refill and pulses intact. Skin:    General: Skin is warm and dry.  Neurological:     General: No focal deficit present.     Mental Status: She is alert and oriented to person, place, and time. Mental status is at baseline.  Psychiatric:         Mood and Affect: Mood normal.        Behavior: Behavior normal.        Thought Content: Thought content normal.        Judgment: Judgment normal.      UC Treatments / Results  Labs (all labs ordered are listed, but only abnormal results are displayed) Labs Reviewed  SARS CORONAVIRUS 2 (TAT 6-24 HRS)  URIC ACID   Narrative:    Performed at:  [address] [place], [place]  [id number] Lab Director: Jolene Schimke MD, Phone:  [phone]    EKG   Radiology No results found.  Procedures Procedures (including critical care time)  Medications Ordered in UC Medications - No data to display  Initial Impression / Assessment and Plan / UC Course  I have reviewed the triage vital signs and the nursing notes.  Pertinent labs & imaging results that were available during my care of the patient were reviewed by me and considered in my medical decision making (see chart for details).    Viral upper respiratory infection  Patient presents with symptoms likely from a viral upper respiratory infection.  Do not suspect underlying cardiopulmonary process.  Symptoms seem unlikely related to ACS, CHF or COPD exacerbations, pneumonia, pneumothorax. Patient is nontoxic appearing and not in need of emergent medical intervention.  COVID test pending per patient request.  Recommended symptom control with over the counter medications, fluids, rest.   2. Left foot and ankle pain  Ankle is showing a small joint effusion but otherwise no acute bony abnormality.  Patient was prescribed prednisone prior to discharge to help alleviate inflammation given over-the-counter medications have not been helpful.  Advised supportive care including elevation and ice application as well.  Advised strict follow-up precautions if symptoms persist or worsen with orthopedist. Was originally suspicious of gout but uric acid level is normal. Patient advised to monitor glucose closely while on  prednisone.   Patient was given strict return and ER precautions for all chief complaints today.  Patient verbalized understanding and was agreeable with plan.  Right foot pain accidentally entered in diagnosis.  Unable to delete it given it was associated with uric acid level. Final Clinical Impressions(s) / UC Diagnoses   Final diagnoses:  Right foot pain  Encounter for laboratory testing for COVID-19 virus  Left foot pain  Acute left ankle pain     Discharge Instructions      I will call if x-ray is abnormal.  I have prescribed you prednisone to decrease inflammation.  Blood work is pending.  We will call if it is abnormal.  Follow-up if symptoms persist or worsen.    ED Prescriptions     Medication Sig Dispense Auth. Provider   predniSONE (DELTASONE) 20 MG tablet Take 2 tablets (40 mg total) by mouth daily for 5 days. 10 tablet Gustavus Bryant, [place]      PDMP not reviewed this encounter.   Gustavus Bryant, [place] 03/08/23 0817    Gustavus Bryant, [place] 03/08/23 [id number]

## 2023-03-05 NOTE — ED Triage Notes (Signed)
Pt reports constant tender pain to left foot x 2 days- Pain in great toe and ball of foot. Denies injury. She has been taking tylenol without relief

## 2023-03-05 NOTE — Discharge Instructions (Signed)
I will call if x-ray is abnormal.  I have prescribed you prednisone to decrease inflammation.  Blood work is pending.  We will call if it is abnormal.  Follow-up if symptoms persist or worsen.

## 2023-03-06 LAB — SARS CORONAVIRUS 2 (TAT 6-24 HRS): SARS Coronavirus 2: NEGATIVE

## 2023-03-07 LAB — URIC ACID: Uric Acid: 4.1 mg/dL (ref 2.6–6.2)

## 2023-03-08 ENCOUNTER — Telehealth: Payer: Self-pay | Admitting: Internal Medicine

## 2023-03-08 NOTE — Telephone Encounter (Signed)
Attempted to call patient to discuss x-ray results but there was no answer.  Left voicemail for call back per privacy policy.

## 2023-03-08 NOTE — Telephone Encounter (Signed)
Pt LVM. RN returned call. Pt advised of the following per Laren Everts, FNP: "she has a joint effusion in the ankle which is a collection of fluid. The prednisone should be helpful with that. uric acid level was normal so should not be related to gout. elevate and ice foot/ankle. follow up with ortho if it persists or worsens." Pt verbalized understanding.

## 2023-04-05 ENCOUNTER — Other Ambulatory Visit (HOSPITAL_COMMUNITY): Payer: Self-pay

## 2023-04-05 MED ORDER — POLYSACCHARIDE IRON COMPLEX 150 MG PO CAPS
150.0000 mg | ORAL_CAPSULE | ORAL | 1 refills | Status: DC
  Filled 2023-04-05 – 2023-04-16 (×2): qty 15, 30d supply, fill #0
  Filled 2023-06-06: qty 15, 30d supply, fill #1
  Filled 2023-08-19: qty 15, 30d supply, fill #2
  Filled 2023-12-09: qty 15, 30d supply, fill #3
  Filled 2024-03-30: qty 15, 30d supply, fill #4

## 2023-04-05 MED ORDER — OZEMPIC (1 MG/DOSE) 4 MG/3ML ~~LOC~~ SOPN
1.0000 mg | PEN_INJECTOR | SUBCUTANEOUS | 1 refills | Status: DC
  Filled 2023-04-05 – 2023-04-16 (×2): qty 3, 28d supply, fill #0
  Filled 2023-08-19: qty 3, 28d supply, fill #1
  Filled 2023-09-22: qty 3, 28d supply, fill #2

## 2023-04-05 MED ORDER — LISINOPRIL-HYDROCHLOROTHIAZIDE 20-12.5 MG PO TABS
1.0000 | ORAL_TABLET | Freq: Every day | ORAL | 1 refills | Status: DC
  Filled 2023-04-05 – 2023-04-16 (×2): qty 30, 30d supply, fill #0
  Filled 2023-11-01: qty 30, 30d supply, fill #1
  Filled 2023-12-09: qty 30, 30d supply, fill #2
  Filled 2024-03-31: qty 30, 30d supply, fill #3

## 2023-04-05 MED ORDER — JARDIANCE 10 MG PO TABS
10.0000 mg | ORAL_TABLET | Freq: Every day | ORAL | 1 refills | Status: AC
  Filled 2023-04-05 – 2023-04-16 (×2): qty 30, 30d supply, fill #0
  Filled 2024-01-07: qty 30, 30d supply, fill #1
  Filled 2024-02-03: qty 30, 30d supply, fill #2

## 2023-04-14 ENCOUNTER — Other Ambulatory Visit (HOSPITAL_COMMUNITY): Payer: Self-pay

## 2023-04-14 MED ORDER — CEFUROXIME AXETIL 500 MG PO TABS
500.0000 mg | ORAL_TABLET | Freq: Two times a day (BID) | ORAL | 0 refills | Status: AC
  Filled 2023-04-14: qty 20, 10d supply, fill #0

## 2023-04-15 ENCOUNTER — Other Ambulatory Visit (HOSPITAL_COMMUNITY): Payer: Self-pay

## 2023-04-16 ENCOUNTER — Other Ambulatory Visit (HOSPITAL_COMMUNITY): Payer: Self-pay

## 2023-06-04 ENCOUNTER — Other Ambulatory Visit (HOSPITAL_COMMUNITY): Payer: Self-pay

## 2023-06-04 MED ORDER — PEG 3350-KCL-NA BICARB-NACL 420 G PO SOLR
ORAL | 1 refills | Status: DC
  Filled 2023-06-04: qty 4000, 1d supply, fill #0

## 2023-06-04 MED ORDER — BISACODYL 5 MG PO TBEC
DELAYED_RELEASE_TABLET | ORAL | 0 refills | Status: DC
  Filled 2023-06-04: qty 4, 1d supply, fill #0

## 2023-06-06 ENCOUNTER — Other Ambulatory Visit: Payer: Self-pay

## 2023-06-10 ENCOUNTER — Other Ambulatory Visit (HOSPITAL_COMMUNITY): Payer: Self-pay

## 2023-06-10 MED ORDER — BENZONATATE 100 MG PO CAPS
100.0000 mg | ORAL_CAPSULE | Freq: Three times a day (TID) | ORAL | 0 refills | Status: AC | PRN
  Filled 2023-06-10: qty 60, 10d supply, fill #0

## 2023-06-11 ENCOUNTER — Ambulatory Visit: Payer: 59

## 2023-08-20 ENCOUNTER — Other Ambulatory Visit (HOSPITAL_COMMUNITY): Payer: Self-pay

## 2023-09-24 ENCOUNTER — Other Ambulatory Visit (HOSPITAL_COMMUNITY): Payer: Self-pay

## 2023-09-24 MED ORDER — OZEMPIC (2 MG/DOSE) 8 MG/3ML ~~LOC~~ SOPN
2.0000 mg | PEN_INJECTOR | SUBCUTANEOUS | 5 refills | Status: AC
  Filled 2023-09-24: qty 3, 7d supply, fill #0
  Filled 2023-09-27 – 2023-10-15 (×3): qty 3, 28d supply, fill #0
  Filled 2023-11-01 – 2023-12-09 (×3): qty 3, 28d supply, fill #1
  Filled 2024-01-07: qty 3, 28d supply, fill #2
  Filled 2024-02-03: qty 3, 28d supply, fill #3
  Filled 2024-03-30: qty 3, 28d supply, fill #4
  Filled 2024-04-28: qty 3, 28d supply, fill #5

## 2023-09-24 MED ORDER — FLUCONAZOLE 100 MG PO TABS
100.0000 mg | ORAL_TABLET | ORAL | 1 refills | Status: DC | PRN
  Filled 2023-09-24: qty 3, 3d supply, fill #0

## 2023-09-27 ENCOUNTER — Other Ambulatory Visit (HOSPITAL_COMMUNITY): Payer: Self-pay

## 2023-10-04 ENCOUNTER — Other Ambulatory Visit (HOSPITAL_COMMUNITY): Payer: Self-pay

## 2023-10-05 ENCOUNTER — Other Ambulatory Visit (HOSPITAL_COMMUNITY): Payer: Self-pay

## 2023-10-15 ENCOUNTER — Other Ambulatory Visit: Payer: Self-pay

## 2023-10-15 ENCOUNTER — Other Ambulatory Visit (HOSPITAL_COMMUNITY): Payer: Self-pay

## 2023-11-01 ENCOUNTER — Other Ambulatory Visit: Payer: Self-pay

## 2023-11-01 ENCOUNTER — Other Ambulatory Visit (HOSPITAL_COMMUNITY): Payer: Self-pay

## 2023-11-15 ENCOUNTER — Other Ambulatory Visit (HOSPITAL_COMMUNITY): Payer: Self-pay

## 2023-12-09 ENCOUNTER — Other Ambulatory Visit (HOSPITAL_COMMUNITY): Payer: Self-pay

## 2024-01-07 ENCOUNTER — Other Ambulatory Visit (HOSPITAL_COMMUNITY): Payer: Self-pay

## 2024-01-11 ENCOUNTER — Other Ambulatory Visit: Payer: Self-pay | Admitting: Registered Nurse

## 2024-01-11 DIAGNOSIS — E041 Nontoxic single thyroid nodule: Secondary | ICD-10-CM

## 2024-01-13 ENCOUNTER — Other Ambulatory Visit: Payer: Self-pay | Admitting: Obstetrics & Gynecology

## 2024-01-13 ENCOUNTER — Other Ambulatory Visit (HOSPITAL_COMMUNITY): Payer: Self-pay

## 2024-01-13 DIAGNOSIS — N921 Excessive and frequent menstruation with irregular cycle: Secondary | ICD-10-CM

## 2024-01-13 MED ORDER — NAPROXEN 500 MG PO TABS
500.0000 mg | ORAL_TABLET | Freq: Two times a day (BID) | ORAL | 0 refills | Status: AC | PRN
  Filled 2024-01-13: qty 30, 15d supply, fill #0

## 2024-01-17 ENCOUNTER — Other Ambulatory Visit (HOSPITAL_COMMUNITY): Payer: Self-pay

## 2024-01-17 MED ORDER — NAPROXEN 500 MG PO TABS
500.0000 mg | ORAL_TABLET | Freq: Two times a day (BID) | ORAL | 5 refills | Status: AC
  Filled 2024-01-17 – 2024-03-30 (×2): qty 30, 15d supply, fill #0

## 2024-01-25 ENCOUNTER — Encounter: Admitting: Obstetrics & Gynecology

## 2024-02-03 ENCOUNTER — Other Ambulatory Visit (HOSPITAL_COMMUNITY): Payer: Self-pay

## 2024-02-03 ENCOUNTER — Other Ambulatory Visit: Payer: Self-pay

## 2024-02-03 MED ORDER — FLUCONAZOLE 150 MG PO TABS
150.0000 mg | ORAL_TABLET | ORAL | 0 refills | Status: AC | PRN
Start: 2024-02-03 — End: ?
  Filled 2024-02-03: qty 3, 3d supply, fill #0

## 2024-02-09 ENCOUNTER — Other Ambulatory Visit: Payer: Self-pay | Admitting: Registered Nurse

## 2024-02-09 DIAGNOSIS — Z1231 Encounter for screening mammogram for malignant neoplasm of breast: Secondary | ICD-10-CM

## 2024-02-14 ENCOUNTER — Ambulatory Visit
Admission: RE | Admit: 2024-02-14 | Discharge: 2024-02-14 | Disposition: A | Discharge status: Home / Self Care | Source: Ambulatory Visit | Attending: Registered Nurse | Admitting: Registered Nurse

## 2024-02-14 DIAGNOSIS — E041 Nontoxic single thyroid nodule: Secondary | ICD-10-CM

## 2024-02-17 ENCOUNTER — Ambulatory Visit: Admitting: Obstetrics & Gynecology

## 2024-03-07 ENCOUNTER — Ambulatory Visit
Admission: RE | Admit: 2024-03-07 | Discharge: 2024-03-07 | Disposition: A | Discharge status: Home / Self Care | Source: Ambulatory Visit

## 2024-03-07 DIAGNOSIS — Z1231 Encounter for screening mammogram for malignant neoplasm of breast: Secondary | ICD-10-CM

## 2024-03-16 ENCOUNTER — Other Ambulatory Visit (HOSPITAL_COMMUNITY): Payer: Self-pay

## 2024-03-16 MED ORDER — METFORMIN HCL ER 500 MG PO TB24
500.0000 mg | ORAL_TABLET | Freq: Two times a day (BID) | ORAL | 3 refills | Status: AC
  Filled 2024-03-16: qty 60, 30d supply, fill #0
  Filled 2024-04-28: qty 60, 30d supply, fill #1
  Filled 2024-05-25: qty 60, 30d supply, fill #2

## 2024-03-16 MED ORDER — NYSTATIN 100000 UNIT/ML MT SUSP
4.0000 mL | Freq: Four times a day (QID) | OROMUCOSAL | 1 refills | Status: AC
Start: 2024-03-16 — End: ?
  Filled 2024-03-16: qty 224, 14d supply, fill #0

## 2024-03-27 ENCOUNTER — Other Ambulatory Visit (HOSPITAL_COMMUNITY): Payer: Self-pay

## 2024-03-27 MED ORDER — ERGOCALCIFEROL 1.25 MG (50000 UT) PO CAPS
50000.0000 [IU] | ORAL_CAPSULE | ORAL | 3 refills | Status: AC
  Filled 2024-03-27: qty 4, 28d supply, fill #0
  Filled 2024-04-28: qty 4, 28d supply, fill #1
  Filled 2024-05-25: qty 4, 28d supply, fill #2

## 2024-03-28 ENCOUNTER — Other Ambulatory Visit (HOSPITAL_COMMUNITY): Payer: Self-pay

## 2024-03-28 ENCOUNTER — Ambulatory Visit: Admitting: Obstetrics & Gynecology

## 2024-03-28 ENCOUNTER — Encounter: Payer: Self-pay | Admitting: Obstetrics & Gynecology

## 2024-03-28 VITALS — BP 131/82 | HR 81 | Wt 213.0 lb

## 2024-03-28 DIAGNOSIS — N951 Menopausal and female climacteric states: Secondary | ICD-10-CM | POA: Diagnosis not present

## 2024-03-28 DIAGNOSIS — Z01419 Encounter for gynecological examination (general) (routine) without abnormal findings: Secondary | ICD-10-CM

## 2024-03-28 MED ORDER — GABAPENTIN 600 MG PO TABS
600.0000 mg | ORAL_TABLET | Freq: Every day | ORAL | 3 refills | Status: AC
  Filled 2024-03-28: qty 30, 30d supply, fill #0

## 2024-03-28 NOTE — Patient Instructions (Addendum)
[email or web address] 

## 2024-03-28 NOTE — Progress Notes (Signed)
 GYNECOLOGY ANNUAL PREVENTATIVE CARE ENCOUNTER NOTE  History:    Nancy Silva is a 48 y.o. [phone] female here for a routine annual gynecologic exam.  Current complaints: none.  Reports that her periods are bring more spaced out, no heavy bleeding.  Reports bothersome hot flashes, night sweats and wants to discuss possible interventions.   Denies abnormal vaginal discharge, pelvic pain, problems with intercourse or other gynecologic concerns.  Gynecologic History Patient's last menstrual period was 01/10/2024 (exact date). Contraception: abstinence Last Pap: 10/21/2021. Result was normal with negative HPV Last Mammogram: 03/07/2024.  Result was normal Last Colonoscopy: 05/19/2023.  Result was normal  Obstetric History OB History  Gravida Para Term Preterm AB Living  3 2 2  0 1 2  SAB IAB Ectopic Multiple Live Births  1 0 0 0     # Outcome Date GA Lbr Len/2nd Weight Sex Type Anes PTL Lv  3 SAB           2 Term           1 Term             Past Medical History:  Diagnosis Date   Abnormal uterine bleeding (AUB)    Arthritis    Diabetes mellitus without complication (HCC)    History of seizures as a child    none since age 40 per patient   Hypertension    Iron  deficiency anemia    Menorrhagia with irregular cycle 11/06/2016   Pre-diabetes     Past Surgical History:  Procedure Laterality Date   CESAREAN SECTION WITH BILATERAL TUBAL LIGATION  02/21/2010   @[facility]    DILITATION & CURRETTAGE/HYSTROSCOPY WITH HYDROTHERMAL ABLATION N/A 11/12/2021   Procedure: DILATATION & /HYSTEROSCOPY WITH HYDROTHERMAL ABLATION;  Surgeon: Herchel Gloris LABOR, MD;  Location: [facility];  Service: Gynecology;  Laterality: N/A;    Current Outpatient Medications on File Prior to Visit  Medication Sig Dispense Refill   benzonatate  (TESSALON ) 100 MG capsule Take 1-2 capsules (100-200 mg total) by mouth 3 (three) times daily as needed for cough. 60 capsule 0   blood glucose meter kit  and supplies KIT Use up to 4 (four) times daily as directed. 1 each 0   ergocalciferol (VITAMIN D2) 1.25 MG (50000 UT) capsule Take 1 capsule (50,000 Units total) by mouth once a week. 12 capsule 3   glucose blood test strip use up to 4 times daily as directed. 100 each 0   iron  polysaccharides (FERREX 150) 150 MG capsule Take 1 capsule (150 mg total) by mouth every other day. 45 capsule 1   lisinopril -hydrochlorothiazide  (ZESTORETIC ) 20-12.5 MG tablet Take 1 tablet by mouth daily. 30 tablet 5   lisinopril -hydrochlorothiazide  (ZESTORETIC ) 20-12.5 MG tablet Take 1 tablet by mouth daily. 90 tablet 3   lisinopril -hydrochlorothiazide  (ZESTORETIC ) 20-12.5 MG tablet Take 1 tablet by mouth daily. 90 tablet 1   metFORMIN  (GLUCOPHAGE -XR) 500 MG 24 hr tablet Take 1 tablet (500 mg total) by mouth 2 (two) times daily with morning and evening meals. 60 tablet 3   Microlet Lancets MISC use up to 4 times daily as directed 100 each 0   Multiple Vitamin (MULTIVITAMIN WITH MINERALS) TABS tablet Take 1 tablet by mouth daily.     naproxen  (NAPROSYN ) 500 MG tablet Take 1 tablet (500 mg total) by mouth 2 (two) times daily with a meal as needed for pain or during heavy menstrual bleeding. 30 tablet 5   naproxen  (NAPROSYN ) 500 MG tablet Take 1 tablet (  500 mg total) by mouth every 12 (twelve) hours as needed with food or milk. 30 tablet 0   nystatin  (MYCOSTATIN ) 100000 UNIT/ML suspension Use as directed 4 mLs (400,000 Units total) in the mouth or throat 4 (four) times daily. 224 mL 1   Semaglutide , 2 MG/DOSE, (OZEMPIC , 2 MG/DOSE,) 8 MG/3ML SOPN Inject 2 mg into the skin once a week. 6 mL 5   cefUROXime  (CEFTIN ) 500 MG tablet Take 1 tablet (500 mg total) by mouth every 12 (twelve) hours for 10 days. (Patient not taking: Reported on 03/28/2024) 20 tablet 0   empagliflozin  (JARDIANCE ) 10 MG TABS tablet Take 1 tablet (10 mg total) by mouth daily. (Patient not taking: Reported on 03/28/2024) 90 tablet 1   empagliflozin  (JARDIANCE )  10 MG TABS tablet Take 1 tablet (10 mg total) by mouth daily. (Patient not taking: Reported on 03/28/2024) 90 tablet 1   EPINEPHrine  (EPI-PEN) 0.3 mg/0.3 mL SOAJ injection Inject 0.3 mLs (0.3 mg total) into the muscle once. (Patient not taking: Reported on 09/09/2021) 1 Device 3   fluconazole  (DIFLUCAN ) 150 MG tablet Take 1 tablet (150 mg total) by mouth as needed. (Patient not taking: Reported on 03/28/2024) 3 tablet 0   No current facility-administered medications on file prior to visit.    Allergies  Allergen Reactions   Peanuts [Nuts]     Throat swelling   Shellfish Allergy     Throat Swelling    Social History:  reports that she quit smoking about 16 years ago. Her smoking use included cigarettes. She has been exposed to tobacco smoke. She has never used smokeless tobacco. She reports that she does not drink alcohol and does not use drugs.  Family History  Problem Relation Age of Onset   Ovarian cancer Mother        Mother died in her 91s, shorlty the pt was born    Breast cancer Cousin        diagnosed at age 56    The following portions of the patient's history were reviewed and updated as appropriate: allergies, current medications, past family history, past medical history, past social history, past surgical history and problem list.  Review of Systems Pertinent items noted in HPI and remainder of comprehensive ROS otherwise negative.  Physical Exam:  BP 131/82   Pulse 81   Wt 213 lb (96.6 kg)   LMP 01/10/2024 (Exact Date)   BMI 33.36 kg/m  CONSTITUTIONAL: Well-developed, well-nourished female in no acute distress.  HENT:  Normocephalic, atraumatic, External right and left ear normal.  EYES: Conjunctivae and EOM are normal. Pupils are equal, round, and reactive to light. No scleral icterus.  NECK: Normal range of motion, supple, no masses observed. SKIN: Skin is warm and dry. No rash noted. Not diaphoretic. No erythema. No pallor. MUSCULOSKELETAL: Normal range of  motion.  No cyanosis, clubbing, or edema. NEUROLOGIC: Alert and oriented to person, place, and time. Normal muscle tone coordination.  PSYCHIATRIC: Normal mood and affect. Normal behavior. Normal judgment and thought content. CARDIOVASCULAR: Normal heart rate noted RESPIRATORY: Effort and breath sounds normal, no problems with respiration noted. BREASTS: Deferred by patient. ABDOMEN: Soft, no distention noted.   PELVIC: Deferred by patient.  Assessment and Plan:    1. Perimenopausal vasomotor symptoms Discussed management of vasomotor symptoms; hormonal and non-hormonal interventions. Referred to [email or web address] for more information. She wants to try Neurontin  for now, will monitor effect.  - gabapentin  (NEURONTIN ) 600 MG tablet; Take 1 tablet (600 mg total) by mouth at  bedtime.  Dispense: 30 tablet; Refill: 3  2. Well woman exam (Primary) Pap, mammogram and colon cancer screening are up to date. Routine preventative health maintenance measures emphasized. Please refer to After Visit Summary for other counseling recommendations.      GLORIS HUGGER, MD, FACOG Obstetrician & Gynecologist, [facility] for [employer], [facility]

## 2024-03-30 ENCOUNTER — Other Ambulatory Visit (HOSPITAL_COMMUNITY): Payer: Self-pay

## 2024-04-12 ENCOUNTER — Other Ambulatory Visit (HOSPITAL_COMMUNITY): Payer: Self-pay

## 2024-04-14 ENCOUNTER — Other Ambulatory Visit (HOSPITAL_COMMUNITY): Payer: Self-pay

## 2024-04-17 ENCOUNTER — Other Ambulatory Visit (HOSPITAL_COMMUNITY): Payer: Self-pay

## 2024-04-17 MED ORDER — LISINOPRIL-HYDROCHLOROTHIAZIDE 20-12.5 MG PO TABS
1.0000 | ORAL_TABLET | Freq: Every day | ORAL | 1 refills | Status: AC
  Filled 2024-04-17: qty 30, 30d supply, fill #0
  Filled 2024-05-25: qty 30, 30d supply, fill #1

## 2024-04-28 ENCOUNTER — Other Ambulatory Visit (HOSPITAL_COMMUNITY): Payer: Self-pay

## 2024-04-28 ENCOUNTER — Other Ambulatory Visit: Payer: Self-pay

## 2024-04-28 MED ORDER — POLYSACCHARIDE IRON COMPLEX 150 MG PO CAPS
150.0000 mg | ORAL_CAPSULE | ORAL | 1 refills | Status: AC
  Filled 2024-04-28: qty 15, 30d supply, fill #0
  Filled 2024-05-25: qty 15, 30d supply, fill #1

## 2024-05-03 ENCOUNTER — Other Ambulatory Visit (HOSPITAL_COMMUNITY): Payer: Self-pay

## 2024-05-25 ENCOUNTER — Other Ambulatory Visit (HOSPITAL_COMMUNITY): Payer: Self-pay
